# Patient Record
Sex: Female | Born: 1960 | Race: Black or African American | Hispanic: No | Marital: Married | Smoking: Never smoker
Health system: Southern US, Community
[De-identification: ages and names within clinical notes are randomized; demographics above are authoritative.]

## PROBLEM LIST (undated history)

## (undated) DIAGNOSIS — E785 Hyperlipidemia, unspecified: Secondary | ICD-10-CM

## (undated) DIAGNOSIS — R809 Proteinuria, unspecified: Secondary | ICD-10-CM

## (undated) DIAGNOSIS — J45909 Unspecified asthma, uncomplicated: Secondary | ICD-10-CM

## (undated) DIAGNOSIS — E119 Type 2 diabetes mellitus without complications: Secondary | ICD-10-CM

## (undated) DIAGNOSIS — E786 Lipoprotein deficiency: Secondary | ICD-10-CM

## (undated) DIAGNOSIS — G4700 Insomnia, unspecified: Secondary | ICD-10-CM

## (undated) DIAGNOSIS — D649 Anemia, unspecified: Secondary | ICD-10-CM

## (undated) DIAGNOSIS — T7840XA Allergy, unspecified, initial encounter: Secondary | ICD-10-CM

## (undated) HISTORY — DX: Hyperlipidemia, unspecified: E78.5

## (undated) HISTORY — DX: Type 2 diabetes mellitus without complications: E11.9

## (undated) HISTORY — DX: Proteinuria, unspecified: R80.9

## (undated) HISTORY — PX: CYST EXCISION: SHX5701

## (undated) HISTORY — DX: Lipoprotein deficiency: E78.6

## (undated) HISTORY — DX: Anemia, unspecified: D64.9

## (undated) HISTORY — DX: Insomnia, unspecified: G47.00

## (undated) HISTORY — DX: Allergy, unspecified, initial encounter: T78.40XA

## (undated) HISTORY — PX: UMBILICAL HERNIA REPAIR: SHX196

---

## 1998-08-16 ENCOUNTER — Emergency Department (HOSPITAL_COMMUNITY): Admission: EM | Admit: 1998-08-16 | Discharge: 1998-08-16 | Payer: Self-pay | Admitting: Emergency Medicine

## 1999-06-11 ENCOUNTER — Emergency Department (HOSPITAL_COMMUNITY): Admission: EM | Admit: 1999-06-11 | Discharge: 1999-06-12 | Payer: Self-pay | Admitting: Internal Medicine

## 1999-11-05 ENCOUNTER — Emergency Department (HOSPITAL_COMMUNITY): Admission: EM | Admit: 1999-11-05 | Discharge: 1999-11-05 | Payer: Self-pay | Admitting: Emergency Medicine

## 2000-04-22 ENCOUNTER — Other Ambulatory Visit: Admission: RE | Admit: 2000-04-22 | Discharge: 2000-04-22 | Payer: Self-pay | Admitting: Gynecology

## 2000-11-25 ENCOUNTER — Other Ambulatory Visit: Admission: RE | Admit: 2000-11-25 | Discharge: 2000-11-25 | Payer: Self-pay | Admitting: Gynecology

## 2001-05-18 ENCOUNTER — Other Ambulatory Visit: Admission: RE | Admit: 2001-05-18 | Discharge: 2001-05-18 | Payer: Self-pay | Admitting: Gynecology

## 2002-06-09 ENCOUNTER — Other Ambulatory Visit: Admission: RE | Admit: 2002-06-09 | Discharge: 2002-06-09 | Payer: Self-pay | Admitting: Gynecology

## 2002-07-12 ENCOUNTER — Emergency Department (HOSPITAL_COMMUNITY): Admission: EM | Admit: 2002-07-12 | Discharge: 2002-07-12 | Payer: Self-pay | Admitting: Emergency Medicine

## 2002-07-12 ENCOUNTER — Encounter: Payer: Self-pay | Admitting: Emergency Medicine

## 2003-06-22 ENCOUNTER — Ambulatory Visit (HOSPITAL_COMMUNITY): Admission: RE | Admit: 2003-06-22 | Discharge: 2003-06-22 | Payer: Self-pay | Admitting: General Surgery

## 2003-06-22 ENCOUNTER — Encounter (INDEPENDENT_AMBULATORY_CARE_PROVIDER_SITE_OTHER): Payer: Self-pay | Admitting: Specialist

## 2004-02-05 ENCOUNTER — Emergency Department (HOSPITAL_COMMUNITY): Admission: EM | Admit: 2004-02-05 | Discharge: 2004-02-05 | Payer: Self-pay | Admitting: Emergency Medicine

## 2008-02-05 ENCOUNTER — Encounter: Admission: RE | Admit: 2008-02-05 | Discharge: 2008-02-05 | Payer: Self-pay | Admitting: Family Medicine

## 2010-09-28 NOTE — Op Note (Signed)
Stacey Simon, YEARWOOD                           ACCOUNT NO.:  [id number]   MEDICAL RECORD NO.:  [id number]                   PATIENT TYPE:  OIB   LOCATION:  2899                                 FACILITY:  [facility]   PHYSICIAN:  Leonie Man, M.D.                DATE OF BIRTH:  [birth year]   DATE OF PROCEDURE:  06/22/2003  DATE OF DISCHARGE:                                 OPERATIVE REPORT   PREOPERATIVE DIAGNOSIS:  Incarcerated epigastric hernia.   POSTOPERATIVE DIAGNOSIS:  Incarcerated epigastric hernia.   OPERATION PERFORMED:  Repair of incarcerated epigastric hernia with mesh.   SURGEON:  Leonie Man, M.D.   ASSISTANT:  Nurse.   ANESTHESIA:  General.   INDICATIONS FOR PROCEDURE:  The patient is a 50 year old woman with a mass  just superior to the umbilicus which has been causing pain after eating and  on examination, very difficult to reduce.  She has not had any symptoms of  bowel obstruction, however.  She comes to the operating room now for repair  of this hernia after the risks and potential benefits of surgery had been  fully discussed and all questions answered.  The patient gives consent.   DESCRIPTION OF PROCEDURE:  Following the induction of satisfactory general  anesthesia which required a rather difficult intubation, the abdomen was  prepped and draped to be included in the sterile operative field.  I made a  vertical incision over the mass just superior to the umbilicus, deepened  through the skin and subcutaneous tissue carrying it down to the hernial  sac.  The hernial sac was dissected from the surrounding soft tissue down to  the fascia.  The fascia was scored and grasped with Kocher clamps.  The  hernial sac was then opened and removed in its entirety and forwarded for  pathologic evaluation.  Incarcerated omentum within the sac appeared to be  viable and was reduced back into the peritoneal cavity.  I placed a layer of  Seprafilm in a slurry into the  abdomen so as to prevent adherence of the  mesh and then placed a medium mesh plug into the defect and closed it to the  fascia with interrupted 0 Novofil sutures.  At the end of this, the repair  appeared to be intact.  Sponge, instrument and sharp counts were verified.  The subcutaneous tissues were then closed with a running 2-0 Vicryl suture.  The skin was closed with a 4-0 Monocryl running subcuticular suture and then  reinforced with Steri-Strips.  Sterile dressing was applied.  Anesthetic was  reversed and the patient removed from the operating room to the recovery  room in stable condition.  She tolerated the procedure well.  Leonie Man, M.D.    PB/MEDQ  D:  06/22/2003  T:  06/22/2003  Job:  [id number]

## 2014-08-26 ENCOUNTER — Other Ambulatory Visit: Payer: Self-pay | Admitting: Podiatry

## 2015-06-25 ENCOUNTER — Emergency Department (HOSPITAL_COMMUNITY)
Admission: EM | Admit: 2015-06-25 | Discharge: 2015-06-25 | Disposition: A | Discharge status: Home / Self Care | Payer: BC Managed Care – PPO | Source: Home / Self Care | Attending: Family Medicine | Admitting: Family Medicine

## 2015-06-25 ENCOUNTER — Encounter (HOSPITAL_COMMUNITY): Payer: Self-pay | Admitting: Emergency Medicine

## 2015-06-25 ENCOUNTER — Encounter (HOSPITAL_COMMUNITY): Payer: Self-pay | Admitting: *Deleted

## 2015-06-25 ENCOUNTER — Emergency Department (HOSPITAL_COMMUNITY)
Admission: EM | Admit: 2015-06-25 | Discharge: 2015-06-26 | Disposition: A | Discharge status: Home / Self Care | Payer: BC Managed Care – PPO | Attending: Emergency Medicine | Admitting: Emergency Medicine

## 2015-06-25 DIAGNOSIS — R0602 Shortness of breath: Secondary | ICD-10-CM | POA: Diagnosis present

## 2015-06-25 DIAGNOSIS — J45901 Unspecified asthma with (acute) exacerbation: Secondary | ICD-10-CM | POA: Diagnosis not present

## 2015-06-25 DIAGNOSIS — R0600 Dyspnea, unspecified: Secondary | ICD-10-CM

## 2015-06-25 HISTORY — DX: Unspecified asthma, uncomplicated: J45.909

## 2015-06-25 MED ORDER — PREDNISONE 20 MG PO TABS
60.0000 mg | ORAL_TABLET | Freq: Once | ORAL | Status: AC
  Administered 2015-06-25: 60 mg via ORAL
  Filled 2015-06-25: qty 3

## 2015-06-25 MED ORDER — IPRATROPIUM-ALBUTEROL 0.5-2.5 (3) MG/3ML IN SOLN
3.0000 mL | Freq: Once | RESPIRATORY_TRACT | Status: AC
  Administered 2015-06-25: 3 mL via RESPIRATORY_TRACT
  Filled 2015-06-25: qty 3

## 2015-06-25 NOTE — ED Provider Notes (Addendum)
CSN: [id number]     Arrival date & time 06/25/15  1912 History   First MD Initiated Contact with Patient 06/25/15 2005     Chief Complaint  Patient presents with  . Shortness of Breath   (Consider location/radiation/quality/duration/timing/severity/associated sxs/prior Treatment) Patient is a 55 y.o. female presenting with shortness of breath. The history is provided by the patient.  Shortness of Breath Severity:  Moderate Onset quality:  Gradual Duration:  2 weeks Progression:  Worsening Chronicity:  Chronic Context: URI and weather changes   Relieved by: pt stopped using inhaler when she felt like she was using it too much. Ineffective treatments:  Inhaler Associated symptoms: wheezing   Associated symptoms: no abdominal pain, no chest pain and no fever   Risk factors: no tobacco use     Past Medical History  Diagnosis Date  . Asthma    History reviewed. No pertinent past surgical history. History reviewed. No pertinent family history. Social History  Substance Use Topics  . Smoking status: None  . Smokeless tobacco: None  . Alcohol Use: None   OB History    No data available     Review of Systems  Constitutional: Negative.  Negative for fever.  HENT: Negative.   Respiratory: Positive for chest tightness, shortness of breath and wheezing.   Cardiovascular: Negative.  Negative for chest pain.  Gastrointestinal: Negative for abdominal pain.  All other systems reviewed and are negative.   Allergies  Review of patient's allergies indicates no known allergies.  Home Medications   Prior to Admission medications   Medication Sig Start Date End Date Taking? Authorizing Provider  albuterol (PROVENTIL HFA;VENTOLIN HFA) 108 (90 Base) MCG/ACT inhaler Inhale into the lungs every 6 (six) hours as needed for wheezing or shortness of breath.   Yes Historical Provider, MD   Meds Ordered and Administered this Visit  Medications - No data to display  BP 169/95 mmHg  Pulse  79  Temp(Src) 98.2 F (36.8 C) (Oral)  Resp 18  SpO2 100% No data found.   Physical Exam  Constitutional: She is oriented to person, place, and time. She appears well-developed and well-nourished. No distress.  HENT:  Right Ear: External ear normal.  Left Ear: External ear normal.  Mouth/Throat: Oropharynx is clear and moist.  Eyes: Pupils are equal, round, and reactive to light.  Neck: Normal range of motion. Neck supple.  Cardiovascular: Normal rate, regular rhythm, normal heart sounds and intact distal pulses.   Pulmonary/Chest: Effort normal. She has decreased breath sounds. She has wheezes. She has no rhonchi.  Lymphadenopathy:    She has cervical adenopathy.  Neurological: She is alert and oriented to person, place, and time.  Skin: Skin is warm and dry.  Nursing note and vitals reviewed.   ED Course  Procedures (including critical care time)  Labs Review Labs Reviewed - No data to display  Imaging Review No results found.   Visual Acuity Review  Right Eye Distance:   Left Eye Distance:   Bilateral Distance:    Right Eye Near:   Left Eye Near:    Bilateral Near:         MDM   1. Dyspnea    Sent for further eval and treatment of dyspnea, worsening.    Billy Fischer, MD 06/25/15 2025  Billy Fischer, MD 06/25/15 2026

## 2015-06-25 NOTE — ED Notes (Signed)
Pt sent here from [facility] due to asthma flareup.

## 2015-06-25 NOTE — ED Provider Notes (Signed)
CSN: [id number]     Arrival date & time 06/25/15  2135 History  By signing my name below, I, Randa Evens, attest that this documentation has been prepared under the direction and in the presence of Ivor Lions, PA-C. Electronically Signed: Randa Evens, ED Scribe. 06/25/2015. 11:48 PM.    Chief Complaint  Patient presents with  . Asthma     The history is provided by the patient. No language interpreter was used.   HPI Comments: Stacey Simon is a 55 y.o. female who presents to the Emergency Department complaining of chest tightness and SOB onset 3 days prior. Pt states that she has a HX of asthma and that her symptoms are similar to when she has an asthma exacerbation. Pt states she has tried her albuterol inhaler with no relief. Pt doesn't report fever, cough or other related symptoms.   Past Medical History  Diagnosis Date  . Asthma    History reviewed. No pertinent past surgical history. No family history on file. Social History  Substance Use Topics  . Smoking status: Never Smoker   . Smokeless tobacco: None  . Alcohol Use: No   OB History    No data available     Review of Systems  Constitutional: Negative for fever.  Respiratory: Positive for chest tightness and shortness of breath. Negative for cough.    10 Systems reviewed and all are negative for acute change except as noted in the HPI.   Allergies  Review of patient's allergies indicates no known allergies.  Home Medications   Prior to Admission medications   Medication Sig Start Date End Date Taking? Authorizing Provider  albuterol (PROVENTIL HFA;VENTOLIN HFA) 108 (90 Base) MCG/ACT inhaler Inhale into the lungs every 6 (six) hours as needed for wheezing or shortness of breath.    Historical Provider, MD   BP 155/78 mmHg  Pulse 62  Temp(Src) 98.2 F (36.8 C) (Oral)  Resp 16  Ht 5\' 6"  (1.676 m)  Wt 137 lb (62.143 kg)  BMI 22.12 kg/m2  SpO2 100%   Physical Exam   Constitutional: She is oriented to person, place, and time. She appears well-developed and well-nourished. No distress.  HENT:  Head: Normocephalic and atraumatic.  Eyes: Conjunctivae and EOM are normal.  Neck: Neck supple. No tracheal deviation present.  Cardiovascular: Normal rate, regular rhythm and normal heart sounds.  Exam reveals no gallop and no friction rub.   No murmur heard. Pulmonary/Chest: Effort normal. No respiratory distress. She has wheezes. She has rales in the right lower field and the left lower field. She exhibits no tenderness.  Diffuse BL end-expiratory wheezes  Musculoskeletal: Normal range of motion.  Neurological: She is alert and oriented to person, place, and time.  Skin: Skin is warm and dry.  Psychiatric: She has a normal mood and affect. Her behavior is normal.  Nursing note and vitals reviewed.   ED Course  Procedures  DIAGNOSTIC STUDIES: Oxygen Saturation is 100% on RA, normal by my interpretation.    COORDINATION OF CARE: 11:51 PM-Discussed treatment plan with pt at bedside and pt agreed to plan.   Imaging Review Dg Chest 2 View  06/26/2015  CLINICAL DATA:  Upper back pain, shortness of breath, and cough tonight. EXAM: CHEST  2 VIEW COMPARISON:  06/21/2003 FINDINGS: Thoracic scoliosis convex towards the right. Normal heart size and pulmonary vascularity. No focal airspace disease or consolidation in the lungs. No blunting of costophrenic angles. No pneumothorax. Mediastinal contours appear intact. Degenerative  changes in the right shoulder. IMPRESSION: No active cardiopulmonary disease. Electronically Signed   By: Lucienne Capers M.D.   On: 06/26/2015 00:42    MDM   Final diagnoses:  Asthma, unspecified asthma severity, with acute exacerbation   Asthma exacerbation. Xray negative. Patient ambulated in ED with O2 saturations maintained >90, no current signs of respiratory distress. Lung exam improved after nebulizer treatment. Prednisone given in  the ED and pt will bd dc with 5 day burst. Pt states they are breathing at baseline. Pt has been instructed to continue using prescribed medications and to speak with PCP about today's exacerbation.   I personally performed the services described in this documentation, which was scribed in my presence. The recorded information has been reviewed and is accurate.  [place] Lions, PA-C 06/27/15 2135  Carmin Muskrat, MD 06/28/15 [phone]

## 2015-06-25 NOTE — ED Notes (Signed)
Shortness  Of breath   History  Of  Asthma    -        Pt     Uses  An inhaler

## 2015-06-25 NOTE — ED Notes (Signed)
Pt c/o tightness due to her asthma, lung sounds clear. Pt states she used her inhaler around 3pm, helped for a little bit.

## 2015-06-26 ENCOUNTER — Emergency Department (HOSPITAL_COMMUNITY): Payer: BC Managed Care – PPO

## 2015-06-26 MED ORDER — ALBUTEROL SULFATE HFA 108 (90 BASE) MCG/ACT IN AERS
1.0000 | INHALATION_SPRAY | Freq: Four times a day (QID) | RESPIRATORY_TRACT | Status: DC | PRN
Start: 2015-06-26 — End: 2015-06-28

## 2015-06-26 MED ORDER — PREDNISONE 20 MG PO TABS: 60.0000 mg | ORAL_TABLET | Freq: Every day | ORAL | Status: DC

## 2015-06-26 NOTE — Discharge Instructions (Signed)
Stacey Simon,  Nice meeting you! Please follow-up with your primary care provider. Return to the emergency department if you develop increasing shortness of breath, fevers, weakness, or do not improve. Feel better soon!  S. Wendie Simmer, PA-C  Asthma, Acute Bronchospasm Acute bronchospasm caused by asthma is also referred to as an asthma attack. Bronchospasm means your air passages become narrowed. The narrowing is caused by inflammation and tightening of the muscles in the air tubes (bronchi) in your lungs. This can make it hard to breathe or cause you to wheeze and cough. CAUSES Possible triggers are:  Animal dander from the skin, hair, or feathers of animals.  Dust mites contained in house dust.  Cockroaches.  Pollen from trees or grass.  Mold.  Cigarette or tobacco smoke.  Air pollutants such as dust, household cleaners, hair sprays, aerosol sprays, paint fumes, strong chemicals, or strong odors.  Cold air or weather changes. Cold air may trigger inflammation. Winds increase molds and pollens in the air.  Strong emotions such as crying or laughing hard.  Stress.  Certain medicines such as aspirin or beta-blockers.  Sulfites in foods and drinks, such as dried fruits and wine.  Infections or inflammatory conditions, such as a flu, cold, or inflammation of the nasal membranes (rhinitis).  Gastroesophageal reflux disease (GERD). GERD is a condition where stomach acid backs up into your esophagus.  Exercise or strenuous activity. SIGNS AND SYMPTOMS   Wheezing.  Excessive coughing, particularly at night.  Chest tightness.  Shortness of breath. DIAGNOSIS  Your health care provider will ask you about your medical history and perform a physical exam. A chest X-ray or blood testing may be performed to look for other causes of your symptoms or other conditions that may have triggered your asthma attack. TREATMENT  Treatment is aimed at reducing  inflammation and opening up the airways in your lungs. Most asthma attacks are treated with inhaled medicines. These include quick relief or rescue medicines (such as bronchodilators) and controller medicines (such as inhaled corticosteroids). These medicines are sometimes given through an inhaler or a nebulizer. Systemic steroid medicine taken by mouth or given through an IV tube also can be used to reduce the inflammation when an attack is moderate or severe. Antibiotic medicines are only used if a bacterial infection is present.  HOME CARE INSTRUCTIONS   Rest.  Drink plenty of liquids. This helps the mucus to remain thin and be easily coughed up. Only use caffeine in moderation and do not use alcohol until you have recovered from your illness.  Do not smoke. Avoid being exposed to secondhand smoke.  You play a critical role in keeping yourself in good health. Avoid exposure to things that cause you to wheeze or to have breathing problems.  Keep your medicines up-to-date and available. Carefully follow your health care provider's treatment plan.  Take your medicine exactly as prescribed.  When pollen or pollution is bad, keep windows closed and use an air conditioner or go to places with air conditioning.  Asthma requires careful medical care. See your health care provider for a follow-up as advised. If you are more than [redacted] weeks pregnant and you were prescribed any new medicines, let your obstetrician know about the visit and how you are doing. Follow up with your health care provider as directed.  After you have recovered from your asthma attack, make an appointment with your outpatient doctor to talk about ways to reduce the likelihood of future attacks. If  you do not have a doctor who manages your asthma, make an appointment with a primary care doctor to discuss your asthma. SEEK IMMEDIATE MEDICAL CARE IF:   You are getting worse.  You have trouble breathing. If severe, call your local  emergency services (911 in the U.S.).  You develop chest pain or discomfort.  You are vomiting.  You are not able to keep fluids down.  You are coughing up yellow, green, brown, or bloody sputum.  You have a fever and your symptoms suddenly get worse.  You have trouble swallowing. MAKE SURE YOU:   Understand these instructions.  Will watch your condition.  Will get help right away if you are not doing well or get worse.   This information is not intended to replace advice given to you by your health care provider. Make sure you discuss any questions you have with your health care provider.   Document Released: 08/14/2006 Document Revised: 05/04/2013 Document Reviewed: 11/04/2012 Elsevier Interactive Patient Education 2016 [employer].   Emergency Department Resource Guide 1) Find a Doctor and Pay Out of Pocket Although you won't have to find out who is covered by your insurance plan, it is a good idea to ask around and get recommendations. You will then need to call the office and see if the doctor you have chosen will accept you as a new patient and what types of options they offer for patients who are self-pay. Some doctors offer discounts or will set up payment plans for their patients who do not have insurance, but you will need to ask so you aren't surprised when you get to your appointment.  2) Contact Your Local Health Department Not all health departments have doctors that can see patients for sick visits, but many do, so it is worth a call to see if yours does. If you don't know where your local health department is, you can check in your phone book. The CDC also has a tool to help you locate your state's health department, and many state websites also have listings of all of their local health departments.  3) Find a [facility] If your illness is not likely to be very severe or complicated, you may want to try a walk in clinic. These are popping up all over the  country in pharmacies, drugstores, and shopping centers. They're usually staffed by nurse practitioners or physician assistants that have been trained to treat common illnesses and complaints. They're usually fairly quick and inexpensive. However, if you have serious medical issues or chronic medical problems, these are probably not your best option.  No Primary Care Doctor: - Call Health Connect at  [phone] - they can help you locate a primary care doctor that  accepts your insurance, provides certain services, etc. - Physician Referral Service- [phone]  Chronic Pain Problems: Organization         Address  Phone   Notes  [facility]  [phone] Patients need to be referred by their primary care doctor.   Medication Assistance: Organization         Address  Phone   Notes  [facility] Medication [facility] [address] [place], [place] [address] [phone] --Must be a resident of [facility] -- Must have NO insurance coverage whatsoever (no Medicaid/ Medicare, etc.) -- The pt. MUST have a primary care doctor that directs their care regularly and follows them in the community   [facility]ist  (866)  [place]  [phone]    Agencies that provide inexpensive medical care: Organization         Address  Phone   Notes  [place]  [phone]   [facility]    [phone]   [facility] [place], [place] [address] [phone]   [place] [address], [place] [phone]   Planned Parenthood    [phone]   [facility]    [phone]   [place] and [place] [address], [place] Phone:  [phone], Fax:  [phone] Hours of Operation:  9 am - 6 pm, M-F.  Also accepts Medicaid/Medicare and self-pay.  [facility] for [place], [address], [place] Phone: [phone], Fax: [phone]. Hours of Operation:  8:30 am - 5:30 pm, M-F.  Also accepts Medicaid and self-pay.  [facility] [address], [place] Phone: [phone]   [place], [place], [place] [phone], Ext. [phone] Mondays & Thursdays: 7-9 AM.  First 15 patients are seen on a first come, first serve basis.    [place] Providers:  Organization         Address  Phone   Notes  [facility] [address], [place] [phone] Also accepts self-pay patients.  [facility] [id number] [place], [place]  [phone]   [place], [address], [place] [phone]   [facility] [address], [place] [phone]   Lucianne Lei [address], [place]   [phone] Only accepts [place] Access [place] patients after they have their name applied to their card.   Self-Pay (no insurance) in [facility]:  Organization         Address  Phone   Notes  Sickle Cell Patients, [facility] [place] [phone]   [facility] [place] [phone]   [facility] [place]  [place], [place], [place] [phone]   [facility]/Dr. Osei-Bonsu  [address] [place] or [address], [place] [phone] Phone number for both [place] and [place] locations is the same.  [facility] [address] [place] [phone]   [facility] [address] [place] or [address] [phone]   [facility] [address] [place] [phone], phone; [phone], fax Sees patients 1st and 3rd Saturday of every month.  Must  not qualify for public or private insurance (i.e. Medicaid, Medicare, [place] Health Choice, Veterans' Benefits)  Household income should be no more than 200% of the poverty level The clinic cannot treat you if you are pregnant or think you are pregnant  Sexually transmitted diseases are not treated at the clinic.    Dental Care: Organization         Address  Phone  Notes  [facility] Department of [facility] [place] [phone] Accepts children up to age 33 who are enrolled in [place] or [place]; pregnant women with  a Medicaid card; and children who have applied for Medicaid or [place] Health Choice, but were declined, whose parents can pay a reduced fee at time of service.  [facility] Department of [facility]  [address], [place] [phone] Accepts children up to age 33 who are enrolled in [place] or [place]; pregnant women with a Medicaid card; and children who have applied for Medicaid or [place] Health Choice, but were declined, whose parents can pay a reduced fee at time of service.  [place] Adult Dental Access PROGRAM  [place] [phone] Patients are seen by appointment only. Walk-ins are not accepted. [place] will see patients 6 years of age and older. Monday - Tuesday (8am-5pm) Most Wednesdays (8:30-5pm) $30 per visit, cash only  [facility] Adult Dental Access PROGRAM  [address], [facility] [phone] Patients are seen by appointment only. Walk-ins are not accepted. [place] will see patients 54 years of age and older. One Wednesday Evening (Monthly: Volunteer Based).  $30 per visit, cash only  [place]  [phone] for adults; Children under age 77, call Graduate Pediatric Dentistry at [phone]. Children aged 66-14, please call [phone] to request a pediatric application.  Dental services are  provided in all areas of dental care including fillings, crowns and bridges, complete and partial dentures, implants, gum treatment, root canals, and extractions. Preventive care is also provided. Treatment is provided to both adults and children. Patients are selected via a lottery and there is often a waiting list.   [facility] [address] [place]  [phone] [email or web address]   [facility] [address] [place], [place] [phone], Ext. [phone] Second and Fourth Thursday of each month, opens at 6:30 AM; Clinic ends at 9 AM.  Patients are seen on a first-come first-served basis, and a limited number are seen during each clinic.   [facility]  [address] Hillard Danker [place], [place] [phone]   Eligibility Requirements You must have lived in [place], [place], or [place] counties for at least the last three months.   You cannot be eligible for state or federal sponsored [employer], including [employer], [place], or [employer].   You generally cannot be eligible for healthcare insurance through your employer.    How to apply: Eligibility screenings are held every Tuesday and Wednesday afternoon from 1:00 pm until 4:00 pm. You do not need an appointment for the interview!  [facility] [address] [place], [place]   [place]  [place] Department  [place]  [phone]    Behavioral Health Resources in the Community: Intensive Outpatient Programs Organization         Address  Phone  Notes  [place]. [address], [place], [place] [phone]   [facility] Outpatient [address] [place], [place]   ADS: Alcohol & Drug Svcs [address] [place], [place]   [place] [address]  [place], [place] or [phone]   Substance Abuse Resources Organization         Address  Phone  Notes  Alcohol and Drug Services  [phone]   [facility]  [phone]   [facility] [place]   [facility]  [phone]   Residential & Outpatient Substance Abuse Program  [phone]   Psychological Services Organization         Address  Phone  Notes  [facility] [place]  [place]  [phone]   [place] [address], [place] or [phone]    Mobile Crisis Teams Organization         Address  Phone  Notes  [facility], Mobile Crisis Care Unit  [phone]   [facility] Psychotherapeutic Services  [address] [place], [place]   [place] [address] [place] [phone]    Self-Help/Support Groups Organization         Address  Phone             Notes  [place]. of [place] - variety of support groups  [place] Call for more information  Narcotics Anonymous (NA), [facility] [address], [employer] [place]  2 meetings at this location   Special educational needs teacher         Address  Phone  Notes  [facility] Residential Treatment [place],    [place]  [phone]   [facility]  [address] [place] [phone], [place], [place]   [place], [place] [phone] Admissions: 8am-3pm M-F  Incentives Substance [place] [address],    [place], [place] [phone]   [facility] [address] [place], [place], [place]   [facility] (2-Rh) [address],  [place], [place]   [facility] - Intensive Outpatient [address] Kristeen [address], [place], [place]   [facility] (Rew.) [place].,  [place], [place] [phone] or  [phone]   [facility] Treatment Services (RTS) [address] [place], [place] Accepts Medicaid  [facility] [address]  [place] [phone] Substance Abuse/Addiction Treatment   [facility] Organization         Address  Phone  Notes  [facility]  [phone]   Domenic Schwab, PhD [address] Arlis Porta [place], [place]   [phone] or [phone]   [place], [place] [phone]   [facility] [address], [place], [place] [phone] Insurance/Medicaid/sponsorship through [facility] and Families [address] [place]                                    [place], [place] [phone] [place] [address], [place] [phone]    Dr. Adele Schilder  [phone]   [facility] [place] Dept. 1) [address] [place] 2) [place] 3)  [place] [address], [place] [phone]  [phone]   [place] [phone] or [phone] (After Hours)

## 2015-06-28 ENCOUNTER — Ambulatory Visit (INDEPENDENT_AMBULATORY_CARE_PROVIDER_SITE_OTHER): Payer: BC Managed Care – PPO | Admitting: Medical

## 2015-06-28 ENCOUNTER — Encounter: Payer: Self-pay | Admitting: Medical

## 2015-06-28 VITALS — BP 130/80 | HR 64 | Ht 65.25 in | Wt 189.0 lb

## 2015-06-28 DIAGNOSIS — J309 Allergic rhinitis, unspecified: Secondary | ICD-10-CM

## 2015-06-28 DIAGNOSIS — G4700 Insomnia, unspecified: Secondary | ICD-10-CM

## 2015-06-28 DIAGNOSIS — Z2821 Immunization not carried out because of patient refusal: Secondary | ICD-10-CM | POA: Diagnosis not present

## 2015-06-28 DIAGNOSIS — J4540 Moderate persistent asthma, uncomplicated: Secondary | ICD-10-CM | POA: Diagnosis not present

## 2015-06-28 MED ORDER — ALBUTEROL SULFATE (2.5 MG/3ML) 0.083% IN NEBU: 2.5000 mg | INHALATION_SOLUTION | Freq: Four times a day (QID) | RESPIRATORY_TRACT | Status: DC | PRN

## 2015-06-28 MED ORDER — ALBUTEROL SULFATE HFA 108 (90 BASE) MCG/ACT IN AERS: 1.0000 | INHALATION_SPRAY | Freq: Four times a day (QID) | RESPIRATORY_TRACT | Status: DC | PRN

## 2015-06-28 MED ORDER — BUDESONIDE 180 MCG/ACT IN AEPB: 2.0000 | INHALATION_SPRAY | Freq: Two times a day (BID) | RESPIRATORY_TRACT | Status: DC

## 2015-06-28 NOTE — Progress Notes (Signed)
Subjective Chief Complaint  Patient presents with  . New Patient (Initial Visit)    asthma problems. sees no other doctors. has normally went to UC. did a pft a while ago   Here as a new patient today.  Has hx/o asthma. primarily been going to urgent care and was advised to get a PCP.  Patient Galen Manila referred her here  Has hx/o asthma, diagnosed as a child.  Lately has intermittent problems with asthma.   Recently had a flare up, was using inhaler a lot.   Ended up getting pro air instead of her usual ventolin, was given prednisone, had normal chest xray.  She notes some improvement , still has some prednisone left to take, and was a little wheezy this morning.  She is a non smoker.   No pets in the home.  Asthma is usually flared up by grasses, dust, environmental allergens.  Not taking allergy medication.  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  Has a second job.  She notes some epigastric tenderness.  Saw urgent care for this in the past, was given stool softener for this.  Has BM daily. Does get some bloating and gas.   This has been an issue for a few months.   Last physical was few years ago.  No prior colonoscopy.     Has problem with sleep, trouble getting to sleep.  Works til 9pm several nights a week.    Past Medical History  Diagnosis Date  . Asthma    ROS as in subjective   Objective: BP 130/80 mmHg  Pulse 64  Ht 5' 5.25" (1.657 m)  Wt 189 lb (85.73 kg)  BMI 31.22 kg/m2  General appearance: alert, no distress, WD/WN, female HEENT: normocephalic, sclerae anicteric, TMs pearly, nares patent, no discharge or erythema, pharynx normal Oral cavity: MMM, no lesions Neck: supple, no lymphadenopathy, no thyromegaly, no masses Heart: RRR, normal S1, S2, no murmurs Lungs: CTA bilaterally, no wheezes, rhonchi, or rales Abdomen: +bs, soft, non tender, non distended, no masses, no hepatomegaly, no splenomegaly Pulses: 2+ symmetric, upper and lower  extremities, normal cap refill Ext: no edema   Assessment: Encounter Diagnoses  Name Primary?  Marland Kitchen Asthma, moderate persistent, uncomplicated Yes  . Allergic rhinitis, unspecified allergic rhinitis type   . Insomnia   . Influenza vaccination declined      Plan: Asthma - discussed her concerns, asthma history.  Begin trial of Pulmicort flex haler 2 puffs BID, c/t albuterol prn.   Discussed proper use of medication, prevention, f/u.   Recheck 84mo for physical and f/u.  Allergic rhinitis - begin Benadryl QHS Insomnia - discussed sleep hygiene, work schedule, and begin Benadryl QHS as a sleep aid  Counseled on the influenza virus vaccine.  Vaccine information sheet given.  Influenza vaccine given after consent obtained.

## 2015-07-31 ENCOUNTER — Other Ambulatory Visit (HOSPITAL_COMMUNITY)
Admission: RE | Admit: 2015-07-31 | Discharge: 2015-07-31 | Disposition: A | Discharge status: Home / Self Care | Payer: BC Managed Care – PPO | Source: Ambulatory Visit | Attending: Medical | Admitting: Medical

## 2015-07-31 ENCOUNTER — Telehealth: Payer: Self-pay

## 2015-07-31 ENCOUNTER — Ambulatory Visit (INDEPENDENT_AMBULATORY_CARE_PROVIDER_SITE_OTHER): Payer: BC Managed Care – PPO | Admitting: Medical

## 2015-07-31 ENCOUNTER — Encounter: Payer: Self-pay | Admitting: Medical

## 2015-07-31 VITALS — BP 140/80 | HR 88 | Resp 16 | Ht 65.0 in | Wt 187.0 lb

## 2015-07-31 DIAGNOSIS — J4540 Moderate persistent asthma, uncomplicated: Secondary | ICD-10-CM | POA: Diagnosis not present

## 2015-07-31 DIAGNOSIS — G4700 Insomnia, unspecified: Secondary | ICD-10-CM

## 2015-07-31 DIAGNOSIS — Z1239 Encounter for other screening for malignant neoplasm of breast: Secondary | ICD-10-CM | POA: Diagnosis not present

## 2015-07-31 DIAGNOSIS — Z124 Encounter for screening for malignant neoplasm of cervix: Secondary | ICD-10-CM

## 2015-07-31 DIAGNOSIS — Z1211 Encounter for screening for malignant neoplasm of colon: Secondary | ICD-10-CM

## 2015-07-31 DIAGNOSIS — Z0000 Encounter for general adult medical examination without abnormal findings: Secondary | ICD-10-CM | POA: Diagnosis not present

## 2015-07-31 DIAGNOSIS — J309 Allergic rhinitis, unspecified: Secondary | ICD-10-CM

## 2015-07-31 DIAGNOSIS — Z23 Encounter for immunization: Secondary | ICD-10-CM

## 2015-07-31 DIAGNOSIS — Z01419 Encounter for gynecological examination (general) (routine) without abnormal findings: Secondary | ICD-10-CM | POA: Diagnosis present

## 2015-07-31 LAB — POCT URINALYSIS DIPSTICK
BILIRUBIN UA: NEGATIVE
Blood, UA: NEGATIVE
GLUCOSE UA: NEGATIVE
KETONES UA: NEGATIVE
LEUKOCYTES UA: NEGATIVE
Nitrite, UA: NEGATIVE
PROTEIN UA: NEGATIVE
Spec Grav, UA: 1.02
Urobilinogen, UA: NEGATIVE
pH, UA: 6

## 2015-07-31 LAB — LIPID PANEL
CHOLESTEROL: 155 mg/dL (ref 125–200)
HDL: 39 mg/dL — ABNORMAL LOW (ref >=46)
LDL Cholesterol: 94 mg/dL (ref <130)
Total CHOL/HDL Ratio: 4 Ratio (ref <=5.0)
Triglycerides: 110 mg/dL (ref <150)
VLDL: 22 mg/dL (ref <30)

## 2015-07-31 LAB — COMPREHENSIVE METABOLIC PANEL
ALT: 17 U/L (ref 6–29)
AST: 18 U/L (ref 10–35)
Albumin: 4.3 g/dL (ref 3.6–5.1)
Alkaline Phosphatase: 78 U/L (ref 33–130)
BUN: 9 mg/dL (ref 7–25)
CHLORIDE: 104 mmol/L (ref 98–110)
CO2: 24 mmol/L (ref 20–31)
CREATININE: 0.88 mg/dL (ref 0.50–1.05)
Calcium: 9.4 mg/dL (ref 8.6–10.4)
Glucose, Bld: 147 mg/dL — ABNORMAL HIGH (ref 65–99)
Potassium: 4.5 mmol/L (ref 3.5–5.3)
SODIUM: 139 mmol/L (ref 135–146)
TOTAL PROTEIN: 7.3 g/dL (ref 6.1–8.1)
Total Bilirubin: 0.5 mg/dL (ref 0.2–1.2)

## 2015-07-31 LAB — CBC
HCT: 37.5 % (ref 36.0–46.0)
Hemoglobin: 12.3 g/dL (ref 12.0–15.0)
MCH: 26.8 pg (ref 26.0–34.0)
MCHC: 32.8 g/dL (ref 30.0–36.0)
MCV: 81.7 fL (ref 78.0–100.0)
MPV: 10 fL (ref 8.6–12.4)
PLATELETS: 347 10*3/uL (ref 150–400)
RBC: 4.59 MIL/uL (ref 3.87–5.11)
RDW: 14.3 % (ref 11.5–15.5)
WBC: 5.6 10*3/uL (ref 4.0–10.5)

## 2015-07-31 LAB — HEMOGLOBIN A1C
Hgb A1c MFr Bld: 7 % — ABNORMAL HIGH (ref <5.7)
MEAN PLASMA GLUCOSE: 154 mg/dL — AB (ref <117)

## 2015-07-31 NOTE — Patient Instructions (Signed)
Encounter Diagnoses  Name Primary?  . Routine general medical examination at a health care facility Yes  . Need for Tdap vaccination   . Need for prophylactic vaccination and inoculation against influenza   . Need for prophylactic vaccination against Streptococcus pneumoniae (pneumococcus)   . Screening for breast cancer   . Screening for cervical cancer   . Special screening for malignant neoplasms, colon   . Asthma, moderate persistent, uncomplicated   . Allergic rhinitis, unspecified allergic rhinitis type   . Insomnia    Recommendations:  Schedule a mammogram  We will call with lab and pap smear results  We will refer you for your first screening colonoscopy  Check insurance coverage for weight loss medications such as Contrave, Qsymia, and Saxenda See your eye doctor yearly for routine vision care. See your dentist yearly for routine dental care including hygiene visits twice yearly. We updated your Tdap (tetanus, diptheria, and acellular pertussis) vaccine today We updated your pneumococcal vaccine today We updated your influenza vaccine today Continue the Pulmicort twice daily for asthma preventative measures Use your albuterol inhaler as needed for wheezing, cough, and shortness of breath   Colonoscopy A colonoscopy is an exam to look at the entire large intestine (colon). This exam can help find problems such as tumors, polyps, inflammation, and areas of bleeding. The exam takes about 1 hour.  LET [facility] CARE PROVIDER KNOW ABOUT:   Any allergies you have.  All medicines you are taking, including vitamins, herbs, eye drops, creams, and over-the-counter medicines.  Previous problems you or members of your family have had with the use of anesthetics.  Any blood disorders you have.  Previous surgeries you have had.  Medical conditions you have. RISKS AND COMPLICATIONS  Generally, this is a safe procedure. However, as with any procedure, complications can  occur. Possible complications include:  Bleeding.  Tearing or rupture of the colon wall.  Reaction to medicines given during the exam.  Infection (rare). BEFORE THE PROCEDURE   Ask your health care provider about changing or stopping your regular medicines.  You may be prescribed an oral bowel prep. This involves drinking a large amount of medicated liquid, starting the day before your procedure. The liquid will cause you to have multiple loose stools until your stool is almost clear or light green. This cleans out your colon in preparation for the procedure.  Do not eat or drink anything else once you have started the bowel prep, unless your health care provider tells you it is safe to do so.  Arrange for someone to drive you home after the procedure. PROCEDURE   You will be given medicine to help you relax (sedative).  You will lie on your side with your knees bent.  A long, flexible tube with a light and camera on the end (colonoscope) will be inserted through the rectum and into the colon. The camera sends video back to a computer screen as it moves through the colon. The colonoscope also releases carbon dioxide gas to inflate the colon. This helps your health care provider see the area better.  During the exam, your health care provider may take a small tissue sample (biopsy) to be examined under a microscope if any abnormalities are found.  The exam is finished when the entire colon has been viewed. AFTER THE PROCEDURE   Do not drive for 24 hours after the exam.  You may have a small amount of blood in your stool.  You may pass  moderate amounts of gas and have mild abdominal cramping or bloating. This is caused by the gas used to inflate your colon during the exam.  Ask when your test results will be ready and how you will get your results. Make sure you get your test results.   This information is not intended to replace advice given to you by your health care provider.  Make sure you discuss any questions you have with your health care provider.   Document Released: 04/26/2000 Document Revised: 02/17/2013 Document Reviewed: 01/04/2013 Elsevier Interactive Patient Education [employer].

## 2015-07-31 NOTE — Progress Notes (Signed)
Subjective:   HPI  Stacey Simon is a 55 y.o. female who presents for a complete physical.  I saw her recently as a new patient to establish care and for asthma.  She has hx/o 2 prior pregnancies, 2 live births, but lost one of her twin sons as an infant.   LMP several years ago in early 29s.  Her husband is also here today for a new patient physical visit.   Reviewed their medical, surgical, family, social, medication, and allergy history and updated chart as appropriate.  Past Medical History  Diagnosis Date  . Allergy   . Asthma     a few prior hospitalizations, last 1992    Past Surgical History  Procedure Laterality Date  . Umbilical hernia repair    . Cesarean section    . Cyst excision      left volar foot    Social History   Social History  . Marital Status: Married    Spouse Name: N/A  . Number of Children: N/A  . Years of Education: N/A   Occupational History  . Not on file.   Social History Main Topics  . Smoking status: Never Smoker   . Smokeless tobacco: Not on file  . Alcohol Use: No  . Drug Use: No  . Sexual Activity: Not on file   Other Topics Concern  . Not on file   Social History Narrative   Married, has 2 children, teaches 6,7,8 grades, teaches health and PE.  Exercise - walk, and exercise with her students.   Has 2nd degree black belt in tae kwon do.   From [place] originally.    Family History  Problem Relation Age of Onset  . Asthma Mother   . Hypertension Mother   . Asthma Sister   . Asthma Brother   . Asthma Daughter   . Asthma Son   . Hypertension Maternal Grandmother   . Hypertension Paternal Grandmother   . Hypertension Paternal Grandfather   . Stroke Paternal Grandfather   . Cancer Maternal Aunt     breast     Current outpatient prescriptions:  .  budesonide (PULMICORT FLEXHALER) 180 MCG/ACT inhaler, Inhale 2 puffs into the lungs 2 (two) times daily., Disp: 1 each, Rfl: 2 .  albuterol (PROVENTIL  HFA;VENTOLIN HFA) 108 (90 Base) MCG/ACT inhaler, Inhale 1-2 puffs into the lungs every 6 (six) hours as needed for wheezing or shortness of breath. (Patient not taking: Reported on 07/31/2015), Disp: 1 Inhaler, Rfl: 0 .  albuterol (PROVENTIL) (2.5 MG/3ML) 0.083% nebulizer solution, Take 3 mLs (2.5 mg total) by nebulization every 6 (six) hours as needed for wheezing or shortness of breath. (Patient not taking: Reported on 07/31/2015), Disp: 75 mL, Rfl: 1 .  predniSONE (DELTASONE) 20 MG tablet, Take 3 tablets (60 mg total) by mouth daily. (Patient not taking: Reported on 07/31/2015), Disp: 15 tablet, Rfl: 0  No Known Allergies   Review of Systems Constitutional: -fever, -chills, -sweats, -unexpected weight change, -decreased appetite, -fatigue Allergy: -sneezing, -itching, -congestion Dermatology: -changing moles, --rash, -lumps ENT: -runny nose, -ear pain, -sore throat, -hoarseness, -sinus pain, -teeth pain, - ringing in ears, -hearing loss, -nosebleeds Cardiology: -chest pain, -palpitations, -swelling, -difficulty breathing when lying flat, -waking up short of breath Respiratory: -cough, -shortness of breath, -difficulty breathing with exercise or exertion, -wheezing, -coughing up blood Gastroenterology: -abdominal pain, -nausea, -vomiting, -diarrhea, -constipation, -blood in stool, -changes in bowel movement, -difficulty swallowing or eating Hematology: -bleeding, -bruising  Musculoskeletal: -joint  aches, -muscle aches, -joint swelling, -back pain, -neck pain, -cramping, -changes in gait Ophthalmology: denies vision changes, eye redness, itching, discharge Urology: -burning with urination, -difficulty urinating, -blood in urine, -urinary frequency, -urgency, -incontinence Neurology: -headache, -weakness, -tingling, -numbness, -memory loss, -falls, -dizziness Psychology: -depressed mood, -agitation, -sleep problems     Objective:   Physical Exam  BP 140/80 mmHg  Pulse 88  Resp 16  Ht 5\' 5"   (1.651 m)  Wt 187 lb (84.823 kg)  BMI 31.12 kg/m2  General appearance: alert, no distress, WD/WN, black female Skin: no worrisome lesions, few scattered macules, dry skin of mid low back HEENT: normocephalic, conjunctiva/corneas normal, sclerae anicteric, PERRLA, EOMi, nares patent, no discharge or erythema, pharynx normal Oral cavity: MMM, tongue normal, teeth in good repair Neck: supple, no lymphadenopathy, no thyromegaly, no masses, normal ROM, no bruits Chest: non tender, normal shape and expansion Heart: RRR, normal S1, S2, no murmurs Lungs: CTA bilaterally, no wheezes, rhonchi, or rales Abdomen: +bs, soft, lower transverse C section scar, umbilical small surgical scar, non tender, non distended, no masses, no hepatomegaly, no splenomegaly, no bruits Back: non tender, normal ROM, no scoliosis Musculoskeletal: surgical scar left volar foot, upper extremities non tender, no obvious deformity, normal ROM throughout, lower extremities non tender, no obvious deformity, normal ROM throughout Extremities: no edema, no cyanosis, no clubbing Pulses: 2+ symmetric, upper and lower extremities, normal cap refill Neurological: alert, oriented x 3, CN2-12 intact, strength normal upper extremities and lower extremities, sensation normal throughout, DTRs 2+ throughout, no cerebellar signs, gait normal Psychiatric: normal affect, behavior normal, pleasant  Breast: nontender, no masses or lumps, no skin changes, no nipple discharge or inversion, no axillary lymphadenopathy Gyn: Normal external genitalia without lesions, vagina with normal mucosa, cervix without lesions, no cervical motion tenderness, no abnormal vaginal discharge.  Uterus and adnexa not enlarged, nontender, no masses.  Pap performed.  Exam chaperoned by nurse. Rectal: small external hemorrhoid noted, otherwise not examined    Assessment and Plan :    Encounter Diagnoses  Name Primary?  . Routine general medical examination at a  health care facility Yes  . Need for Tdap vaccination   . Need for prophylactic vaccination and inoculation against influenza   . Need for prophylactic vaccination against Streptococcus pneumoniae (pneumococcus)   . Screening for breast cancer   . Screening for cervical cancer   . Special screening for malignant neoplasms, colon   . Asthma, moderate persistent, uncomplicated   . Allergic rhinitis, unspecified allergic rhinitis type   . Insomnia     Physical exam - discussed healthy lifestyle, diet, exercise, preventative care, vaccinations, and addressed their concerns.  Handout given.  Counseled on the Tdap (tetanus, diptheria, and acellular pertussis) vaccine.  Vaccine information sheet given. Tdap vaccine given after consent obtained.  Counseled on the pneumococcal vaccine.  Vaccine information sheet given.  Pneumococcal vaccine PPSV 23 given after consent obtained.  Counseled on the influenza virus vaccine.  Vaccine information sheet given.  Influenza vaccine given after consent obtained.  Recommendations:  Schedule a mammogram  We will call with lab and pap smear results  We will refer you for your first screening colonoscopy  Check insurance coverage for weight loss medications such as Contrave, Qsymia, and Saxenda See your eye doctor yearly for routine vision care. See your dentist yearly for routine dental care including hygiene visits twice yearly. We updated your Tdap (tetanus, diptheria, and acellular pertussis) vaccine today We updated your pneumococcal vaccine today We updated your influenza  vaccine today Continue the Pulmicort twice daily for asthma preventative measures Use your albuterol inhaler as needed for wheezing, cough, and shortness of breath  Follow-up pending labs

## 2015-07-31 NOTE — Telephone Encounter (Signed)
[facility] cancelled thinking that this was a duplicate but we sent in husband and wife referrals so it is not a duplicate

## 2015-08-01 LAB — CYTOLOGY - PAP

## 2015-08-09 ENCOUNTER — Telehealth: Payer: Self-pay | Admitting: Medical

## 2015-08-09 NOTE — Telephone Encounter (Signed)
Please call about labs. Unfortunately her labs show new onset diabetes type 2 and good cholesterol is low.   Pap smear normal. Rest of labs ok.  Please have her return at her convenience to discuss this in detail (30 min appt).   Please also have Stacey Simon report her lab issue to her boss.   Not sure why but neither her nor her husband's labs came back in my inbox.  We had to get Stacey Simon to print them when I realized I didn't get results back on time.   Not sure what happened but this needs to be fixed.

## 2015-08-10 NOTE — Telephone Encounter (Signed)
VM not set up.

## 2015-08-12 DIAGNOSIS — E119 Type 2 diabetes mellitus without complications: Secondary | ICD-10-CM

## 2015-08-12 DIAGNOSIS — E786 Lipoprotein deficiency: Secondary | ICD-10-CM

## 2015-08-12 HISTORY — DX: Lipoprotein deficiency: E78.6

## 2015-08-12 HISTORY — DX: Type 2 diabetes mellitus without complications: E11.9

## 2015-08-14 NOTE — Telephone Encounter (Signed)
L[facility]

## 2015-08-15 ENCOUNTER — Encounter: Payer: Self-pay | Admitting: Medical

## 2015-08-15 NOTE — Telephone Encounter (Signed)
Sent letter

## 2015-08-22 ENCOUNTER — Encounter: Payer: Self-pay | Admitting: Medical

## 2015-08-28 ENCOUNTER — Encounter: Payer: Self-pay | Admitting: Medical

## 2015-08-28 ENCOUNTER — Encounter: Payer: Self-pay | Admitting: Gastroenterology

## 2015-08-28 ENCOUNTER — Other Ambulatory Visit: Payer: Self-pay

## 2015-08-28 ENCOUNTER — Ambulatory Visit (INDEPENDENT_AMBULATORY_CARE_PROVIDER_SITE_OTHER): Payer: BC Managed Care – PPO | Admitting: Medical

## 2015-08-28 VITALS — BP 142/90 | HR 90 | Wt 187.0 lb

## 2015-08-28 DIAGNOSIS — E786 Lipoprotein deficiency: Secondary | ICD-10-CM

## 2015-08-28 DIAGNOSIS — E119 Type 2 diabetes mellitus without complications: Secondary | ICD-10-CM

## 2015-08-28 DIAGNOSIS — J4530 Mild persistent asthma, uncomplicated: Secondary | ICD-10-CM | POA: Diagnosis not present

## 2015-08-28 DIAGNOSIS — E669 Obesity, unspecified: Secondary | ICD-10-CM | POA: Diagnosis not present

## 2015-08-28 DIAGNOSIS — Z1231 Encounter for screening mammogram for malignant neoplasm of breast: Secondary | ICD-10-CM

## 2015-08-28 MED ORDER — FLUTICASONE-SALMETEROL 250-50 MCG/DOSE IN AEPB: 1.0000 | INHALATION_SPRAY | Freq: Two times a day (BID) | RESPIRATORY_TRACT | Status: DC

## 2015-08-28 MED ORDER — LIRAGLUTIDE 18 MG/3ML ~~LOC~~ SOPN: 1.8000 mg | PEN_INJECTOR | Freq: Every day | SUBCUTANEOUS | Status: DC

## 2015-08-28 NOTE — Patient Instructions (Signed)
Type 2 Diabetes  Diabetes is a long-lasting (chronic) disease.  With diabetes, either the pancreas does not make enough of a hormone called insulin, or the body has trouble using the insulin that is made.  Over time, diabetes can damage the eyes, kidneys, and nerves causing retinopathy, nephropathy, and neuropathy.  Diabetes puts you at risk for heart disease and peripheral vascular disease which can lead to heart attack, stroke, foot ulcers, and amputations.    Our goal and hopefully your goal is to manage your diabetes in such a way to slow the progression of the disease and do all we can to keep you healthy  Home Care:   Eat healthy, exercise regularly, limit alcohol, and don't smoke!  Check your blood sugar (glucose) once a day before breakfast, or as indicated by our discussion today.  Take your medications daily, don't run out of medications.  Learn about low blood sugar (hypoglycemia). Know how to treat it.  Wear a necklace or bracelet that says you have diabetes.  Check your feet every night for cuts, sores, blisters, and redness. Tell your medical provider if you have problems.  Maintain a normal body weight, or normal BMI - height to weight ratio of 20-25.  Ask me about this.  BEGIN Metformin 500mg , 1 tablet daily in the morning to help control sugar  BEGIN checking your sugar fasting in the morning, 2-3 days per week   GET HELP RIGHT AWAY IF:  You have trouble keeping your blood sugar in target range.  You have problems with your medicines.  You are sick and not getting better after 24 hours.  You have a sore or wound that is not healing.  You have vision problems or changes.  You have a fever.  Diet: make diet changes as we discussed today  Exercise regularly since it has beneficial effects on the heart and blood sugars. Exercising at least three times per week or 150 minutes per week can be as important as medication to a diabetic.  Find some form of exercise  that you will enjoy doing regularly.  This can include walking, biking, kayaking, golfing, swimming, dance, aerobics, hiking, etc.  If you have joint problems, many local gyms have equipment to accommodate people with specific needs.    Vaccinations:  Diabetics are at increased risk for infection, and illnesses can take longer to resolve.  Current vaccine recommendations include yearly Influenza (flu) vaccine (recommended in October), Pneumococcal vaccine, Hepatitis B vaccine series, Tdap (tetanus, diptheria and pertussis) vaccine every 10 years, and other age appropriate vaccinations.     Office visits:  We recommend routine medical care to make sure we are addressing prevention and issues as they arise.  Typically this could mean twice yearly or up to quarterly depending upon your unique health situation.  Exams should include a yearly physical, a yearly foot exam, and other examination as appropriate.  You should see an eye doctor yearly to help screen for and prevent blood vessel complications in your eyes.  Labs: Diabetics should have blood work done at least twice yearly to monitor your Hemoglobin A1C (a three-month average of your blood sugars) and your cholesterol.  You should have your urine and blood checked yearly to screen for kidney damage.  This may include creatinine and micro-albumin levels.  Other labs as appropriate.    Blood pressure goals:  Goal blood pressure in diabetics should be 130/80 or less. Monitoring your blood pressure with a home blood pressure cuff of  your own is an excellent idea.  If you are prescribed medication for blood pressure, take your medication every day, and don't run out of medication.  Having high blood pressure can damage your heart, eyes, kidneys, and put you at risk for heart attack and stroke.  Tobacco use:  If you smoke, dip or chew, quitting will reduce your risk of heart attack, stroke, peripheral vascular disease, and many cancers.    Diabetic  Report Card for Stacey Simon  August 28, 2015  Below is a summary of recent tests related to your diabetes that can help you manage your health.   Hemoglobin A1C:  Your Hemoglobin A1C values should be less than 7. If these are greater than 7, you have a higher chance of having eye, heart, and kidney problems in the future.   Your most recent Hemoglobin A1C values were:  HGB A1C MFR BLD (%)  Date Value  07/31/2015 7.0*     Cholesterol:  Your LDL Cholesterol (bad cholesterol) values should be less than 100 mg/dL if you do not have cardiovascular disease.  The LDL should be less than 70 mg/dL if you do have cardiovascular disease.  If your LDL is consistently higher than 100 mg/dL, then your risk of heart attack and stroke increases yearly.   Your most recent LDL Cholesterol (bad cholesterol) results were:  HDL is under 40!  We prefer the number to be above 60. Your HDL Cholesterol (good cholesterol) values should be higher than 40 mg/dL.  If your HDL is lower than 40 mg/dL, this increases your risk of heart attack and stroke.    Blood Pressure:  Your blood pressure values should be less than 130/80. Please contact me if your readings at home are consistently higher than this.   Your most recent blood pressure readings at our clinic were:  BP Readings from Last 3 Encounters:  08/28/15 142/90  07/31/15 140/80  06/28/15 130/80    Urine Protein: Having an elevated microalbumin to creatinine ratio is a marker for early kidney damage due to diabetes or high blood pressure.      Fat and Cholesterol Restricted Diet High levels of fat and cholesterol in your blood may lead to various health problems, such as diseases of the heart, blood vessels, gallbladder, liver, and pancreas. Fats are concentrated sources of energy that come in various forms. Certain types of fat, including saturated fat, may be harmful in excess. Cholesterol is a substance needed by your body in small  amounts. Your body makes all the cholesterol it needs. Excess cholesterol comes from the food you eat. When you have high levels of cholesterol and saturated fat in your blood, health problems can develop because the excess fat and cholesterol will gather along the walls of your blood vessels, causing them to narrow. Choosing the right foods will help you control your intake of fat and cholesterol. This will help keep the levels of these substances in your blood within normal limits and reduce your risk of disease. WHAT TYPES OF FAT SHOULD I CHOOSE?  Choose healthy fats more often. Choose monounsaturated and polyunsaturated fats, such as olive and canola oil, flaxseeds, walnuts, almonds, and seeds.  Eat more omega-3 fats. Good choices include salmon, mackerel, sardines, tuna, flaxseed oil, and ground flaxseeds. Aim to eat fish at least two times a week.  Limit saturated fats. Saturated fats are primarily found in animal products, such as meats, butter, and cream. Plant sources of saturated fats include palm oil,  palm kernel oil, and coconut oil.  Avoid foods with partially hydrogenated oils in them. These contain trans fats. Examples of foods that contain trans fats are stick margarine, some tub margarines, cookies, crackers, and other baked goods. WHAT GENERAL GUIDELINES DO I NEED TO FOLLOW? These guidelines for healthy eating will help you control your intake of fat and cholesterol:  Check food labels carefully to identify foods with trans fats or high amounts of saturated fat.  Fill one half of your plate with vegetables and green salads.  Fill one fourth of your plate with whole grains. Look for the word "whole" as the first word in the ingredient list.  Fill one fourth of your plate with lean protein foods.  Limit fruit to two servings a day. Choose fruit instead of juice.  Eat more foods that contain soluble fiber. Examples of foods that contain this type of fiber are apples, broccoli,  carrots, beans, peas, and barley. Aim to get 20-30 g of fiber per day.  Eat more home-cooked food and less restaurant, buffet, and fast food.  Limit or avoid alcohol.  Limit foods high in starch and sugar.  Limit fried foods.  Cook foods using methods other than frying. Baking, boiling, grilling, and broiling are all great options.  Lose weight if you are overweight. Losing just 5-10% of your initial body weight can help your overall health and prevent diseases such as diabetes and heart disease. WHAT FOODS CAN I EAT? Grains Whole grains, such as whole wheat or whole grain breads, crackers, cereals, and pasta. Unsweetened oatmeal, bulgur, barley, quinoa, or brown rice. Corn or whole wheat flour tortillas. Vegetables Fresh or frozen vegetables (raw, steamed, roasted, or grilled). Green salads. Fruits All fresh, canned (in natural juice), or frozen fruits. Meat and Other Protein Products Ground beef (85% or leaner), grass-fed beef, or beef trimmed of fat. Skinless chicken or [place]. Ground chicken or [place]. Pork trimmed of fat. All fish and seafood. Eggs. Dried beans, peas, or lentils. Unsalted nuts or seeds. Unsalted canned or dry beans. Dairy Low-fat dairy products, such as skim or 1% milk, 2% or reduced-fat cheeses, low-fat ricotta or cottage cheese, or plain low-fat yogurt. Fats and Oils Tub margarines without trans fats. Light or reduced-fat mayonnaise and salad dressings. Avocado. Olive, canola, sesame, or safflower oils. Natural peanut or almond butter (choose ones without added sugar and oil). The items listed above may not be a complete list of recommended foods or beverages. Contact your dietitian for more options. WHAT FOODS ARE NOT RECOMMENDED? Grains White bread. White pasta. White rice. Cornbread. Bagels, pastries, and croissants. Crackers that contain trans fat. Vegetables White potatoes. Corn. Creamed or fried vegetables. Vegetables in a cheese sauce. Fruits Dried  fruits. Canned fruit in light or heavy syrup. Fruit juice. Meat and Other Protein Products Fatty cuts of meat. Ribs, chicken wings, bacon, sausage, bologna, salami, chitterlings, fatback, hot dogs, bratwurst, and packaged luncheon meats. Liver and organ meats. Dairy Whole or 2% milk, cream, half-and-half, and cream cheese. Whole milk cheeses. Whole-fat or sweetened yogurt. Full-fat cheeses. Nondairy creamers and whipped toppings. Processed cheese, cheese spreads, or cheese curds. Sweets and Desserts Corn syrup, sugars, honey, and molasses. Candy. Jam and jelly. Syrup. Sweetened cereals. Cookies, pies, cakes, donuts, muffins, and ice cream. Fats and Oils Butter, stick margarine, lard, shortening, ghee, or bacon fat. Coconut, palm kernel, or palm oils. Beverages Alcohol. Sweetened drinks (such as sodas, lemonade, and fruit drinks or punches). The items listed above may not be a  complete list of foods and beverages to avoid. Contact your dietitian for more information.   This information is not intended to replace advice given to you by your health care provider. Make sure you discuss any questions you have with your health care provider.   Document Released: 04/29/2005 Document Revised: 05/20/2014 Document Reviewed: 07/28/2013 Elsevier Interactive Patient Education [employer].

## 2015-08-28 NOTE — Addendum Note (Signed)
Addended by: Billie Lade on: 08/28/2015 02:51 PM   Modules accepted: Orders

## 2015-08-28 NOTE — Progress Notes (Signed)
Subjective: Chief Complaint  Patient presents with  . Follow-up    discuss labs. no questions yet   Here to discuss the recent abnormal labs from her physical showing new onset diabetes and low HDL.    Past Medical History  Diagnosis Date  . Allergy   . Asthma     a few prior hospitalizations, last 1992  . Diabetes mellitus without complication ([place]) [id number]  . Low HDL (under 40) 08/2015   ROS as in subjective   Objective: BP 142/90 mmHg  Pulse 90  Wt 187 lb (84.823 kg)  Gen: wd, wn, nad    Assessment: Encounter Diagnoses  Name Primary?  . Diabetes mellitus, new onset ([place]) Yes  . Low HDL (under 40)   . Obesity   . Asthma, mild persistent, uncomplicated     Plan: Diabetes - discussed the recent labs, new diagnosis of diabetes type 2, diet changes recommended, exercise, discussed pathology, possible complications, f/u, and monitoring.  Begin checking glucose fasting a few times per week.  Begin trial of Victoza for diabetes and to help with weight loss.   Other considerations are Saxenda and Metformin.   She is agreeable to Victoza trial.  Refer to nutritionist.  Low HDL - discussed diagnosis, risks for heart disease, diet recommendations  Obesity - c/t efforts at healthy diet, exercise, weight loss  Asthma - trial of Advair.  Not as good control as desired on Pulmicort.  Spent > 30 minutes face to face with patient in discussion of symptoms, evaluation, plan and recommendations.

## 2015-09-11 ENCOUNTER — Telehealth: Payer: Self-pay | Admitting: *Deleted

## 2015-09-11 HISTORY — PX: COLONOSCOPY: SHX174

## 2015-09-11 MED ORDER — PEN NEEDLES 32G X 4 MM MISC: 1.0000 | Freq: Every day | Status: DC

## 2015-09-11 NOTE — Telephone Encounter (Signed)
Pen needles sent. LM on pts VM

## 2015-09-11 NOTE — Addendum Note (Signed)
Addended by: Billie Lade on: 09/11/2015 04:52 PM   Modules accepted: Orders, Medications

## 2015-09-11 NOTE — Telephone Encounter (Signed)
Patient needs pen needled for her victoza sent to [facility] [address]-please call once sent, thanks.

## 2015-09-12 ENCOUNTER — Ambulatory Visit
Admission: RE | Admit: 2015-09-12 | Discharge: 2015-09-12 | Disposition: A | Discharge status: Home / Self Care | Payer: BC Managed Care – PPO | Source: Ambulatory Visit

## 2015-09-12 DIAGNOSIS — Z1231 Encounter for screening mammogram for malignant neoplasm of breast: Secondary | ICD-10-CM

## 2015-09-14 ENCOUNTER — Telehealth: Payer: Self-pay | Admitting: Family Medicine

## 2015-09-14 NOTE — Telephone Encounter (Signed)
Pt called for mammo results and my chart questions and asked what good diabetes numbers were.  Sending her diabetes info.

## 2015-09-18 ENCOUNTER — Ambulatory Visit (AMBULATORY_SURGERY_CENTER): Payer: Self-pay

## 2015-09-18 ENCOUNTER — Encounter: Payer: Self-pay | Admitting: Gastroenterology

## 2015-09-18 VITALS — Ht 66.5 in | Wt 178.4 lb

## 2015-09-18 DIAGNOSIS — Z1211 Encounter for screening for malignant neoplasm of colon: Secondary | ICD-10-CM

## 2015-09-18 MED ORDER — SUPREP BOWEL PREP KIT 17.5-3.13-1.6 GM/177ML PO SOLN: 1.0000 | Freq: Once | ORAL | Status: DC

## 2015-10-02 ENCOUNTER — Encounter: Payer: Self-pay | Admitting: Gastroenterology

## 2015-10-02 ENCOUNTER — Ambulatory Visit (AMBULATORY_SURGERY_CENTER): Payer: BC Managed Care – PPO | Admitting: Gastroenterology

## 2015-10-02 VITALS — BP 105/73 | HR 80 | Temp 96.6°F | Resp 16 | Ht 66.5 in | Wt 178.0 lb

## 2015-10-02 DIAGNOSIS — D120 Benign neoplasm of cecum: Secondary | ICD-10-CM

## 2015-10-02 DIAGNOSIS — D374 Neoplasm of uncertain behavior of colon: Secondary | ICD-10-CM | POA: Diagnosis not present

## 2015-10-02 DIAGNOSIS — Z1211 Encounter for screening for malignant neoplasm of colon: Secondary | ICD-10-CM

## 2015-10-02 LAB — GLUCOSE, CAPILLARY
GLUCOSE-CAPILLARY: 93 mg/dL (ref 65–99)
GLUCOSE-CAPILLARY: 74 mg/dL (ref 65–99)
Glucose-Capillary: 62 mg/dL — ABNORMAL LOW (ref 65–99)

## 2015-10-02 MED ORDER — SODIUM CHLORIDE 0.9 % IV SOLN: 500.0000 mL | INTRAVENOUS | Status: DC

## 2015-10-02 NOTE — Progress Notes (Signed)
Specimen from cecal polyp/ mass was sent [facility] per Dr. Havery Moros. maw

## 2015-10-02 NOTE — Progress Notes (Signed)
A/ox3, pleased with MAC, report to RN 

## 2015-10-02 NOTE — Progress Notes (Signed)
Called to room to assist during endoscopic procedure.  Patient ID and intended procedure confirmed with present staff. Received instructions for my participation in the procedure from the performing physician.  

## 2015-10-02 NOTE — Op Note (Signed)
[facility] Patient Name: Stacey Simon Procedure Date: 10/02/2015 1:20 PM MRN: [id number] Endoscopist: Remo Lipps P. Havery Moros , MD Age: 55 Referring MD:  Date of Birth: 01-25-1961 Gender: Female Procedure:                Colonoscopy Indications:              Screening for malignant neoplasm in the colon, This                            is the patient's first colonoscopy Medicines:                Monitored Anesthesia Care Procedure:                Pre-Anesthesia Assessment:                           - Prior to the procedure, a History and Physical                            was performed, and patient medications and                            allergies were reviewed. The patient's tolerance of                            previous anesthesia was also reviewed. The risks                            and benefits of the procedure and the sedation                            options and risks were discussed with the patient.                            All questions were answered, and informed consent                            was obtained. Prior Anticoagulants: The patient has                            taken no previous anticoagulant or antiplatelet                            agents. ASA Grade Assessment: II - A patient with                            mild systemic disease. After reviewing the risks                            and benefits, the patient was deemed in                            satisfactory condition to undergo the procedure.  After obtaining informed consent, the colonoscope                            was passed under direct vision. Throughout the                            procedure, the patient's blood pressure, pulse, and                            oxygen saturations were monitored continuously. The                            Model CF-HQ190L [id number]) scope was introduced                            through the anus and  advanced to the the cecum,                            identified by appendiceal orifice and ileocecal                            valve. The colonoscopy was performed without                            difficulty. The patient tolerated the procedure                            well. The quality of the bowel preparation was                            adequate. The ileocecal valve, appendiceal orifice,                            and rectum were photographed. Scope In: 1:33:10 PM Scope Out: 1:55:00 PM Scope Withdrawal Time: 0 hours 15 minutes 12 seconds  Total Procedure Duration: 0 hours 21 minutes 50 seconds  Findings:                 The perianal and digital rectal examinations were                            normal.                           A large polypoid lesion (few cm's at least) was                            found in the cecum, superior to the AO. The lesion                            was sessile and crossed a few folds. The central                            area of it was friable and with some erosive  changes. Polypectomy was not attempted. Superficial                            biopsies were obtained with a cold forceps for                            histology to rule out malignancy.                           Non-bleeding internal hemorrhoids were found during                            retroflexion. The hemorrhoids were small.                           The exam was otherwise without abnormality. No                            other polyps appreciated. Complications:            No immediate complications. Estimated blood loss:                            Minimal. Estimated Blood Loss:     Estimated blood loss was minimal. Impression:               - Rule out malignancy, polypoid lesion in the                            cecum. Biopsied.                           - Non-bleeding internal hemorrhoids.                           - The examination was otherwise  normal. Recommendation:           - Patient has a contact number available for                            emergencies. The signs and symptoms of potential                            delayed complications were discussed with the                            patient. Return to normal activities tomorrow.                            Written discharge instructions were provided to the                            patient.                           - Resume previous diet.                           -  Continue present medications.                           - Await pathology results.                           - Further recommendations will be made pending                            biopsy results. We will discuss modality for                            removal (repeat colonoscopy with EMR if non                            malignant) versus surgical resection, pending                            pathology results. Remo Lipps P. Armbruster, MD 10/02/2015 2:02:24 PM This report has been signed electronically.

## 2015-10-02 NOTE — Patient Instructions (Signed)

## 2015-10-03 ENCOUNTER — Telehealth: Payer: Self-pay

## 2015-10-03 NOTE — Telephone Encounter (Signed)
  Follow up Call-  Call back number 10/02/2015  Post procedure Call Back phone  # [phone]  Permission to leave phone message Yes     Patient questions:  Do you have a fever, pain , or abdominal swelling? No. Pain Score  0 *  Have you tolerated food without any problems? Yes.    Have you been able to return to your normal activities? Yes.    Do you have any questions about your discharge instructions: Diet   No. Medications  No. Follow up visit  No.  Do you have questions or concerns about your Care? No.  Actions: * If pain score is 4 or above: No action needed, pain <4.

## 2015-10-11 ENCOUNTER — Telehealth: Payer: Self-pay | Admitting: Gastroenterology

## 2015-10-12 NOTE — Telephone Encounter (Signed)
Spoke with [facility] scheduling([phone]). The MD is still reviewing records. They state patient will be contacted when scheduled and a packet will be mailed to her. Patient can call them to check on appointment scheduling. Sharl Ma is handling the records at [facility]. Left a message for patient with this information.

## 2015-10-17 ENCOUNTER — Telehealth: Payer: Self-pay | Admitting: *Deleted

## 2015-10-17 NOTE — Telephone Encounter (Signed)
Spoke with Stacey Simon at [facility] and they will be scheduling patient this week. Called and left patient a message that she should be hearing this week from [facility].

## 2015-10-17 NOTE — Telephone Encounter (Signed)
Spoke with [facility] scheduling to see if patient has been scheduled with Dr. Arsenio Loader. They will call back with update.

## 2015-10-17 NOTE — Telephone Encounter (Signed)
-----   Message from Hulan Saas, RN sent at 10/05/2015  2:52 PM EDT ----- Did patient get scheduled at [facility] with Dr. Arsenio Loader. SA.

## 2015-11-08 LAB — HM COLONOSCOPY

## 2015-11-15 ENCOUNTER — Encounter: Payer: Self-pay | Admitting: Medical

## 2015-11-17 ENCOUNTER — Other Ambulatory Visit: Payer: Self-pay | Admitting: Medical

## 2015-11-20 ENCOUNTER — Encounter: Payer: Self-pay | Admitting: Medical

## 2015-11-20 NOTE — Telephone Encounter (Signed)
Is this ok to refill?  

## 2015-11-27 ENCOUNTER — Ambulatory Visit (INDEPENDENT_AMBULATORY_CARE_PROVIDER_SITE_OTHER): Payer: BC Managed Care – PPO | Admitting: Medical

## 2015-11-27 ENCOUNTER — Encounter: Payer: Self-pay | Admitting: Medical

## 2015-11-27 VITALS — BP 110/70 | HR 74 | Wt 169.0 lb

## 2015-11-27 DIAGNOSIS — E118 Type 2 diabetes mellitus with unspecified complications: Secondary | ICD-10-CM

## 2015-11-27 DIAGNOSIS — L84 Corns and callosities: Secondary | ICD-10-CM

## 2015-11-27 DIAGNOSIS — Z860100 Personal history of colon polyps, unspecified: Secondary | ICD-10-CM

## 2015-11-27 DIAGNOSIS — J4540 Moderate persistent asthma, uncomplicated: Secondary | ICD-10-CM | POA: Diagnosis not present

## 2015-11-27 DIAGNOSIS — M21619 Bunion of unspecified foot: Secondary | ICD-10-CM | POA: Diagnosis not present

## 2015-11-27 DIAGNOSIS — R748 Abnormal levels of other serum enzymes: Secondary | ICD-10-CM

## 2015-11-27 LAB — POCT GLYCOSYLATED HEMOGLOBIN (HGB A1C): HEMOGLOBIN A1C: 6.4

## 2015-11-27 MED ORDER — DULAGLUTIDE 1.5 MG/0.5ML ~~LOC~~ SOAJ: 1.5000 mg | SUBCUTANEOUS | Status: DC

## 2015-11-27 MED ORDER — FLUTICASONE-SALMETEROL 250-50 MCG/DOSE IN AEPB: 1.0000 | INHALATION_SPRAY | Freq: Two times a day (BID) | RESPIRATORY_TRACT | Status: DC

## 2015-11-27 NOTE — Patient Instructions (Signed)
Type 2 Diabetes  Diabetes is a long-lasting (chronic) disease.  With diabetes, either the pancreas does not make enough of a hormone called insulin, or the body has trouble using the insulin that is made.  Over time, diabetes can damage the eyes, kidneys, and nerves causing retinopathy, nephropathy, and neuropathy.  Diabetes puts you at risk for heart disease and peripheral vascular disease which can lead to heart attack, stroke, foot ulcers, and amputations.    Our goal and hopefully your goal is to manage your diabetes in such a way to slow the progression of the disease and do all we can to keep you healthy  Home Care:   Eat healthy, exercise regularly, limit alcohol, and don't smoke!  Check your blood sugar (glucose) once a day before breakfast, or as indicated by our discussion today.  Take your medications daily, don't run out of medications.  Learn about low blood sugar (hypoglycemia). Know how to treat it.  Wear a necklace or bracelet that says you have diabetes.  Check your feet every night for cuts, sores, blisters, and redness. Tell your medical provider if you have problems.  Maintain a normal body weight, or normal BMI - height to weight ratio of 20-25.  Ask me about this.  GET HELP RIGHT AWAY IF:  You have trouble keeping your blood sugar in target range.  You have problems with your medicines.  You are sick and not getting better after 24 hours.  You have a sore or wound that is not healing.  You have vision problems or changes.  You have a fever.  Diet: continue healthy diet  Exercise regularly since it has beneficial effects on the heart and blood sugars. Exercising at least three times per week or 150 minutes per week can be as important as medication to a diabetic.  Find some form of exercise that you will enjoy doing regularly.  This can include walking, biking, kayaking, golfing, swimming, dance, aerobics, hiking, etc.  If you have joint problems, many  local gyms have equipment to accommodate people with specific needs.    Vaccinations:  Diabetics are at increased risk for infection, and illnesses can take longer to resolve.  Current vaccine recommendations include yearly Influenza (flu) vaccine (recommended in October), Pneumococcal vaccine, Hepatitis B vaccine series, Tdap (tetanus, diptheria and pertussis) vaccine every 10 years, and other age appropriate vaccinations.     Office visits:  We recommend routine medical care to make sure we are addressing prevention and issues as they arise.  Typically this could mean twice yearly or up to quarterly depending upon your unique health situation.  Exams should include a yearly physical, a yearly foot exam, and other examination as appropriate.  You should see an eye doctor yearly to help screen for and prevent blood vessel complications in your eyes.  Labs: Diabetics should have blood work done at least twice yearly to monitor your Hemoglobin A1C (a three-month average of your blood sugars) and your cholesterol.  You should have your urine and blood checked yearly to screen for kidney damage.  This may include creatinine and micro-albumin levels.  Other labs as appropriate.    Blood pressure goals:  Goal blood pressure in diabetics should be 130/80 or less. Monitoring your blood pressure with a home blood pressure cuff of your own is an excellent idea.  If you are prescribed medication for blood pressure, take your medication every day, and don't run out of medication.  Having high blood pressure can  damage your heart, eyes, kidneys, and put you at risk for heart attack and stroke.  Tobacco use:  If you smoke, dip or chew, quitting will reduce your risk of heart attack, stroke, peripheral vascular disease, and many cancers.    Diabetic Report Card for Augustina Ishihara  November 27, 2015  Below is a summary of recent tests related to your diabetes that can help you manage your health.    Hemoglobin A1C:  Your Hemoglobin A1C values should be less than 7. If these are greater than 7, you have a higher chance of having eye, heart, and kidney problems in the future.   Your most recent Hemoglobin A1C values were:  HgbA1C 6.4% today!  Improved  Cholesterol:  Your LDL Cholesterol (bad cholesterol) values should be less than 100 mg/dL if you do not have cardiovascular disease.  The LDL should be less than 70 mg/dL if you do have cardiovascular disease.  If your LDL is consistently higher than 100 mg/dL, then your risk of heart attack and stroke increases yearly.   Your most recent LDL Cholesterol (bad cholesterol) results were:  LDL CHOLESTEROL (mg/dL)  Date Value  07/31/2015 94     Your HDL Cholesterol (good cholesterol) values should be higher than 40 mg/dL.  If your HDL is lower than 40 mg/dL, this increases your risk of heart attack and stroke.   Your most recent HDL Cholesterol (bad cholesterol) results were:  Lipid Panel     Component Value Date/Time   CHOL 155 07/31/2015 0001   TRIG 110 07/31/2015 0001   HDL 39* 07/31/2015 0001   CHOLHDL 4.0 07/31/2015 0001   VLDL 22 07/31/2015 0001   LDLCALC 94 07/31/2015 0001       Blood Pressure:  Your blood pressure values should be less than 130/80. Please contact me if your readings at home are consistently higher than this.   Your most recent blood pressure readings at our clinic were:  BP Readings from Last 3 Encounters:  11/27/15 110/70  10/02/15 105/73  08/28/15 142/90

## 2015-11-27 NOTE — Progress Notes (Signed)
Subjective: Chief Complaint  Patient presents with  . Follow-up    3 month f/up. fasting sugar running 76-135. no problems or concerns.   Here for f/u.  Back in April she had new diagnosis of diabetes.  Taking Victoza daily.   Checking sugars most days, fasting and sometimes 2 hours after meals.   Seeing low 100s, occasion numbers in the 70s.  Eating healthy, exercising.   She notes even low 100s post prandial 2 hours after meals.  Never saw nutritionist due to scheduling conflicts.     Since last visit had 2 colonoscopies, had large polyp.  Had to see doctor in [place] at specialist.   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  Has to have repeat colonoscopy.    Asthma - using Advair, doing fine on this, not having to use albuterol much.   No other issues.  Past Medical History  Diagnosis Date  . Allergy   . Asthma     a few prior hospitalizations, last 1992  . Diabetes mellitus without complication ([place]) [id number]  . Low HDL (under 40) 08/2015  . Anemia     in college   ROS as in subjective   Objective: BP 110/70 mmHg  Pulse 74  Wt 169 lb (76.658 kg)  Wt Readings from Last 3 Encounters:  11/27/15 169 lb (76.658 kg)  10/02/15 178 lb (80.74 kg)  09/18/15 178 lb 6.4 oz (80.922 kg)   General appearance: alert, no distress, WD/WN, AA female Oral cavity: MMM, no lesions Neck: supple, no lymphadenopathy, no thyromegaly, no masses Heart: RRR, normal S1, S2, no murmurs Lungs: CTA bilaterally, no wheezes, rhonchi, or rales Ext: no edema Pulses: 2+ symmetric, upper and lower extremities, normal cap refill  Diabetic Foot Exam - Simple   Simple Foot Form  Diabetic Foot exam was performed with the following findings:  Yes 11/27/2015 10:58 AM  Visual Inspection  See comments:  Yes  Sensation Testing  Intact to touch and monofilament testing bilaterally:  Yes  Pulse Check  Posterior Tibialis and Dorsalis pulse intact  bilaterally:  Yes  Comments  Left volar foot with tender roundish patch of rough skin, bilat moderate bunions, lateral side of right toe with callous       Assessment Encounter Diagnoses  Name Primary?  . Diabetes mellitus with complication ([place]) Yes  . Low serum HDL   . Asthma, moderate persistent, uncomplicated   . Pre-ulcerative corn or callous   . Bunion of great toe   . History of colonic polyps       Plan: Hgb A1C 6.4% at goal.   discussed diagnosis of diabetes, possible complications, general recommendations, foot exams, eye exams, f/u.    Change from Victoza to Trulicity for ease of use and weekly instead of daily.  Discussed possibly adding statin next time, ASA 81mg  QHS.   C/t healthy diet, routine exercise, glucose monitoring, goals of diabetes care.   F/u with podiatry about bunion, callous.   Can try OTC bunion guard.    Asthma - doing well, c/t preventative Advair  F/u with GI regarding polyp  F/u 62mo.   Soniyah was seen today for follow-up.  Diagnoses and all orders for this visit:  Diabetes mellitus with complication ([place]) -     HM DIABETES FOOT EXAM  Low serum HDL  Asthma, moderate persistent, uncomplicated  Pre-ulcerative corn or callous  Bunion of great toe  History of colonic  polyps  Other orders -     Dulaglutide (TRULICITY) 1.5 0000000 SOPN; Inject 1.5 mg into the skin once a week. -     Fluticasone-Salmeterol (ADVAIR DISKUS) 250-50 MCG/DOSE AEPB; Inhale 1 puff into the lungs 2 (two) times daily.

## 2015-12-22 ENCOUNTER — Other Ambulatory Visit: Payer: Self-pay | Admitting: Medical

## 2016-01-11 ENCOUNTER — Emergency Department (HOSPITAL_COMMUNITY): Payer: BC Managed Care – PPO

## 2016-01-11 ENCOUNTER — Emergency Department (HOSPITAL_COMMUNITY)
Admission: EM | Admit: 2016-01-11 | Discharge: 2016-01-12 | Disposition: A | Discharge status: Home / Self Care | Payer: BC Managed Care – PPO | Attending: Emergency Medicine | Admitting: Emergency Medicine

## 2016-01-11 ENCOUNTER — Encounter (HOSPITAL_COMMUNITY): Payer: Self-pay | Admitting: Emergency Medicine

## 2016-01-11 DIAGNOSIS — J45909 Unspecified asthma, uncomplicated: Secondary | ICD-10-CM | POA: Insufficient documentation

## 2016-01-11 DIAGNOSIS — Z794 Long term (current) use of insulin: Secondary | ICD-10-CM | POA: Diagnosis not present

## 2016-01-11 DIAGNOSIS — R0789 Other chest pain: Secondary | ICD-10-CM | POA: Insufficient documentation

## 2016-01-11 DIAGNOSIS — E119 Type 2 diabetes mellitus without complications: Secondary | ICD-10-CM | POA: Diagnosis not present

## 2016-01-11 DIAGNOSIS — R079 Chest pain, unspecified: Secondary | ICD-10-CM

## 2016-01-11 LAB — CBC
HCT: 36.1 % (ref 36.0–46.0)
Hemoglobin: 11.7 g/dL — ABNORMAL LOW (ref 12.0–15.0)
MCH: 27.2 pg (ref 26.0–34.0)
MCHC: 32.4 g/dL (ref 30.0–36.0)
MCV: 84 fL (ref 78.0–100.0)
PLATELETS: 309 10*3/uL (ref 150–400)
RBC: 4.3 MIL/uL (ref 3.87–5.11)
RDW: 14.2 % (ref 11.5–15.5)
WBC: 8.2 10*3/uL (ref 4.0–10.5)

## 2016-01-11 LAB — BASIC METABOLIC PANEL
Anion gap: 6 (ref 5–15)
BUN: 10 mg/dL (ref 6–20)
CHLORIDE: 105 mmol/L (ref 101–111)
CO2: 27 mmol/L (ref 22–32)
CREATININE: 0.86 mg/dL (ref 0.44–1.00)
Calcium: 9 mg/dL (ref 8.9–10.3)
GFR calc Af Amer: >60 mL/min (ref >60)
GFR calc non Af Amer: >60 mL/min (ref >60)
Glucose, Bld: 185 mg/dL — ABNORMAL HIGH (ref 65–99)
Potassium: 3.4 mmol/L — ABNORMAL LOW (ref 3.5–5.1)
Sodium: 138 mmol/L (ref 135–145)

## 2016-01-11 LAB — I-STAT TROPONIN, ED: Troponin i, poc: 0 ng/mL (ref 0.00–0.08)

## 2016-01-11 NOTE — ED Triage Notes (Signed)
Per EMS, pt began to experience sudden onset centeralized non-radiating chest pain around 2115.  Prior to that she has two loose stools and had begun to have an "upset stomach".  She reported that it was difficult to take a deep breath so she used her inhaler (for asthma.) Pain was rated at 10/10, was given 324 ASA and 2 nitro which decreased to 4/10.  Hx of asthma and diabetes.

## 2016-01-11 NOTE — ED Provider Notes (Signed)
[facility] DEPT Provider Note   CSN: [id number] Arrival date & time: 01/11/16  2249  By signing my name below, I, Dora Sims, attest that this documentation has been prepared under the direction and in the presence of physician practitioner, Orpah Greek, MD. Electronically Signed: Dora Sims, Scribe. 01/11/2016. 11:22 PM.  History   Chief Complaint Chief Complaint  Patient presents with  . Chest Pain    The history is provided by the patient. No language interpreter was used.     HPI Comments: Stefanny Winkowski is a 55 y.o. female brought in by EMS, with PMHx of DM and asthma, who presents to the Emergency Department complaining of sudden onset, constant, gradually improving, chest tightness beginning around 9 PM tonight. Pt reports she had a BM tonight, laid down, had another BM several minutes later, and experienced chest tightness. She reports she used her inhaler with no relief and her tightness worsened. Pt reports her BM's were diarrhea. Pt endorses associated cramping abdominal pain, chest pain, and SOB, all of which have gradually improved. She states her chest tightness did not feel like asthma; she states she wheezes with asthma but denies wheezing tonight. Pt reports she has experienced chest tightness in the past, but not as severe. She denies nausea, vomiting, or any other associated symptoms.  Past Medical History:  Diagnosis Date  . Allergy   . Anemia    in college  . Asthma    a few prior hospitalizations, last 1992  . Diabetes mellitus without complication ([place]) Q000111Q  . Low HDL (under 40) 08/2015    Patient Active Problem List   Diagnosis Date Noted  . Diabetes mellitus with complication ([place]) 123456  . Low serum HDL 11/27/2015  . History of colonic polyps 11/27/2015  . Bunion of great toe 11/27/2015  . Pre-ulcerative corn or callous 11/27/2015  . Asthma, moderate persistent 06/28/2015  . Rhinitis, allergic 06/28/2015  .  Insomnia 06/28/2015  . Influenza vaccination declined 06/28/2015    Past Surgical History:  Procedure Laterality Date  . CESAREAN SECTION    . CYST EXCISION     left volar foot  . UMBILICAL HERNIA REPAIR      OB History    No data available       Home Medications    Prior to Admission medications   Medication Sig Start Date End Date Taking? Authorizing Provider  Dulaglutide (TRULICITY) 1.5 0000000 SOPN Inject 1.5 mg into the skin once a week. 11/27/15   Camelia Eng Tysinger, PA-C  Fluticasone-Salmeterol (ADVAIR DISKUS) 250-50 MCG/DOSE AEPB Inhale 1 puff into the lungs 2 (two) times daily. 11/27/15   Camelia Eng Tysinger, PA-C  Insulin Pen Needle (PEN NEEDLES) 32G X 4 MM MISC 1 each by Does not apply route daily. 09/11/15   Camelia Eng Tysinger, PA-C  PROAIR HFA 108 (90 Base) MCG/ACT inhaler INHALE 1-2 PUFFS INTO THE LUNGS EVERY 6 HOURS AS NEEDED FOR WHEEZING OR SHORTNESS OF BREATH. Patient not taking: Reported on 11/27/2015 11/20/15   Carlena Hurl, PA-C    Family History Family History  Problem Relation Age of Onset  . Asthma Mother   . Hypertension Mother   . Asthma Sister   . Asthma Daughter   . Asthma Son   . Asthma Brother   . Hypertension Maternal Grandmother   . Hypertension Paternal Grandmother   . Hypertension Paternal Grandfather   . Stroke Paternal Grandfather   . Cancer Maternal Aunt     breast  .  Colon cancer Neg Hx     Social History Social History  Substance Use Topics  . Smoking status: Never Smoker  . Smokeless tobacco: Never Used  . Alcohol use No     Allergies   Review of patient's allergies indicates no known allergies.   Review of Systems Review of Systems  Respiratory: Positive for chest tightness and shortness of breath. Negative for wheezing.   Cardiovascular: Positive for chest pain.  Gastrointestinal: Positive for abdominal pain and diarrhea. Negative for nausea and vomiting.  All other systems reviewed and are negative.   Physical  Exam Updated Vital Signs BP 134/73 (BP Location: Left Arm)   Pulse 86   Temp 98.4 F (36.9 C) (Oral)   Resp 16   Ht 5' 6.5" (1.689 m)   Wt 169 lb (76.7 kg)   SpO2 97%   BMI 26.87 kg/m   Physical Exam  Constitutional: She is oriented to person, place, and time. She appears well-developed and well-nourished. No distress.  HENT:  Head: Normocephalic and atraumatic.  Right Ear: Hearing normal.  Left Ear: Hearing normal.  Nose: Nose normal.  Mouth/Throat: Oropharynx is clear and moist and mucous membranes are normal.  Eyes: Conjunctivae and EOM are normal. Pupils are equal, round, and reactive to light.  Neck: Normal range of motion. Neck supple.  Cardiovascular: Regular rhythm, S1 normal and S2 normal.  Exam reveals no gallop and no friction rub.   No murmur heard. Pulmonary/Chest: Effort normal and breath sounds normal. No respiratory distress. She exhibits no tenderness.  Abdominal: Soft. Normal appearance and bowel sounds are normal. There is no hepatosplenomegaly. There is no tenderness. There is no rebound, no guarding, no tenderness at McBurney's point and negative Murphy's sign. No hernia.  Musculoskeletal: Normal range of motion.  Neurological: She is alert and oriented to person, place, and time. She has normal strength. No cranial nerve deficit or sensory deficit. Coordination normal. GCS eye subscore is 4. GCS verbal subscore is 5. GCS motor subscore is 6.  Skin: Skin is warm, dry and intact. No rash noted. No cyanosis.  Psychiatric: She has a normal mood and affect. Her speech is normal and behavior is normal. Thought content normal.  Nursing note and vitals reviewed.  ED Treatments / Results  Labs (all labs ordered are listed, but only abnormal results are displayed) [place], ED    EKG  EKG Interpretation None       Radiology No results found.  Procedures Procedures (including critical care  time)  DIAGNOSTIC STUDIES: Oxygen Saturation is 97% on RA, normal by my interpretation.    COORDINATION OF CARE: 11:22 PM Discussed treatment plan with pt at bedside and pt agreed to plan.  Medications Ordered in ED Medications - No data to display   Initial Impression / Assessment and Plan / ED Course  I have reviewed the triage vital signs and the nursing notes.  Pertinent labs & imaging results that were available during my care of the patient were reviewed by me and considered in my medical decision making (see chart for details).  Clinical Course    Patient presented to the ER for evaluation of chest pain. She reports that she was experiencing mostly tightness across her chest with mild shortness of breath. Symptoms came on somewhat suddenly at rest. She has not had any previous cardiac history. Patient thought she might be having asthma, used her inhaler but it did not help. She  does not endorse any exertional component. She did not identify any alleviating or exacerbating factor. Initial EKG did not show evidence of ischemia. Troponin negative. CT angiography performed to further evaluate, no PE noted. Second troponin performed and is negative. At this point, patient is felt to be low risk for cardiac etiology, or purpura for discharge and prompt follow-up with primary doctor.  I personally performed the services described in this documentation, which was scribed in my presence. The recorded information has been reviewed and is accurate.   Final Clinical Impressions(s) / ED Diagnoses   Final diagnoses:  None  chest tightness   New Prescriptions New Prescriptions   No medications on file     Orpah Greek, MD 01/12/16 [phone]

## 2016-01-12 ENCOUNTER — Emergency Department (HOSPITAL_COMMUNITY): Payer: BC Managed Care – PPO

## 2016-01-12 LAB — I-STAT TROPONIN, ED: Troponin i, poc: 0 ng/mL (ref 0.00–0.08)

## 2016-01-12 MED ORDER — IOPAMIDOL (ISOVUE-370) INJECTION 76%
INTRAVENOUS | Status: AC
  Administered 2016-01-12: 02:00:00
  Filled 2016-01-12: qty 100

## 2016-01-12 MED ORDER — TRAMADOL HCL 50 MG PO TABS: 50.0000 mg | ORAL_TABLET | Freq: Four times a day (QID) | ORAL | 0 refills | Status: DC | PRN

## 2016-01-12 MED ORDER — AMOXICILLIN 500 MG PO CAPS: 500.0000 mg | ORAL_CAPSULE | Freq: Three times a day (TID) | ORAL | 0 refills | Status: DC

## 2016-01-12 NOTE — ED Notes (Signed)
Patient transported to CT 

## 2016-01-19 ENCOUNTER — Telehealth: Payer: Self-pay | Admitting: Medical

## 2016-01-19 NOTE — Telephone Encounter (Signed)
Pt called and stated that she would like to be placed back on Victoza. She states that while she has been on Trulicity she has had issues with chest tightness and she believes it is coming form that medication. Pt can be reached at [phone] and uses [facility] [address]

## 2016-01-21 ENCOUNTER — Other Ambulatory Visit: Payer: Self-pay | Admitting: Medical

## 2016-01-22 ENCOUNTER — Other Ambulatory Visit: Payer: Self-pay | Admitting: Medical

## 2016-01-22 MED ORDER — LIRAGLUTIDE 18 MG/3ML ~~LOC~~ SOPN: 1.8000 mg | PEN_INJECTOR | Freq: Every day | SUBCUTANEOUS | 2 refills | Status: DC

## 2016-01-22 NOTE — Telephone Encounter (Signed)
Left message for pt to call. Needs an appt per Audelia Acton.

## 2016-01-22 NOTE — Telephone Encounter (Signed)
I sent it , but get her back in for f/u and discuss chest pain to make sure nothing else causing this

## 2016-01-24 ENCOUNTER — Ambulatory Visit (INDEPENDENT_AMBULATORY_CARE_PROVIDER_SITE_OTHER): Payer: BC Managed Care – PPO | Admitting: Medical

## 2016-01-24 ENCOUNTER — Encounter: Payer: Self-pay | Admitting: Medical

## 2016-01-24 VITALS — BP 110/70 | HR 64 | Resp 12 | Ht 66.5 in | Wt 174.0 lb

## 2016-01-24 DIAGNOSIS — Z23 Encounter for immunization: Secondary | ICD-10-CM

## 2016-01-24 DIAGNOSIS — Z91018 Allergy to other foods: Secondary | ICD-10-CM

## 2016-01-24 DIAGNOSIS — E118 Type 2 diabetes mellitus with unspecified complications: Secondary | ICD-10-CM

## 2016-01-24 DIAGNOSIS — J4540 Moderate persistent asthma, uncomplicated: Secondary | ICD-10-CM

## 2016-01-24 DIAGNOSIS — J309 Allergic rhinitis, unspecified: Secondary | ICD-10-CM | POA: Diagnosis not present

## 2016-01-24 DIAGNOSIS — R0789 Other chest pain: Secondary | ICD-10-CM

## 2016-01-24 NOTE — Progress Notes (Signed)
Subjective: Chief Complaint  Patient presents with  . Follow-up    ER chest tightness    Here for ED visit f/u.     Few weeks ago had stomach churning right after bowel movement. Had to go back for second BM.   Shortly thereafter, had sudden chest tightness.  Had never had anything like this before.   Used the albuterol and this didn't help.  EMS came, gave her NTG under tongue, but this didn't help  However, oxygen did help.   When she left ED felt better.  Since the ED visit hasn't had any more symptoms.   Leading up to the day she went to the ED, had not been having wheezing, SOB.   In general doesn't think her asthma has been an issue of late on Advair, but sometimes when eating fish gets wheezing.  She had allergy testing years ago when she came to [facility] and had many allergies to dust, pollen, trees, several others.   She doesn't think she was stressed the day of the ED visit. The day she went to the ED, this was her last day of work, retirement day!  Using Advair daily, no recent albuterol use in last few days.    As a side note, we recently switched back to Victoza at her request.   tolerated this better than Trulicity.   Not on BP or cholesterol medication.  Past Medical History:  Diagnosis Date  . Allergy   . Anemia    in college  . Asthma    a few prior hospitalizations, last 1992  . Diabetes mellitus without complication ([place]) [id number]  . Low HDL (under 40) 08/2015   Past Surgical History:  Procedure Laterality Date  . CESAREAN SECTION    . CYST EXCISION     left volar foot  . UMBILICAL HERNIA REPAIR      ROS as in subjective  Objective: BP 110/70   Pulse 64   Resp 12   Ht 5' 6.5" (1.689 m)   Wt 174 lb (78.9 kg)   BMI 27.66 kg/m   General appearance: Alert, WD/WN, no distress                             Skin: warm, no rash                           Head: no sinus tenderness                            Eyes: conjunctiva normal, corneas clear, PERRLA               Ears: pearly TMs, external ear canals normal                          Nose: septum midline, turbinates swollen, with erythema and clear discharge             Mouth/throat: MMM, tongue normal, mild pharyngeal erythema                           Neck: supple, no adenopathy, no thyromegaly, non tender                          Heart: RRR, normal S1, S2,  no murmurs                         Lungs: CTA bilaterally, no wheezes, rales, or rhonchi   Ext: no edema Pulses normal Abdomen: +bs, soft, nontender, no mass, no organomegaly    Assessment: Encounter Diagnoses  Name Primary?  Marland Kitchen Asthma, moderate persistent, uncomplicated Yes  . Allergic rhinitis, unspecified allergic rhinitis type   . Chest tightness   . Need for prophylactic vaccination and inoculation against influenza   . 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 Diabetes mellitus with complication [facility])     Plan: Reviewed her recent ED visit, EKG, chest CT from 01/12/2016 visit.  Potassium and hemoglobin slightly low, otherwise no major cause of her chest tightness found.     PFT done today and reviewed, abnormal.   Advised she begin daily OTC antihistamine, c/t Advair, Proair.  Call report on symptoms in 2-3 weeks.  Will get food allergy lab  Stacey Simon was seen today for follow-up.  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

## 2016-01-25 LAB — FOOD ALLERGY PANEL
CORN: 0.52 kU/L — AB
Clams: <0.1 kU/L
Egg White IgE: <0.1 kU/L
FISH COD: 0.17 kU/L — AB
Milk IgE: <0.1 kU/L
Peanut IgE: 0.43 kU/L — ABNORMAL HIGH
SOYBEAN IGE: 0.21 kU/L — AB
WHEAT IGE: 0.53 kU/L — AB
Walnut: 0.45 kU/L — ABNORMAL HIGH

## 2016-04-10 ENCOUNTER — Other Ambulatory Visit: Payer: Self-pay

## 2016-04-10 DIAGNOSIS — R062 Wheezing: Secondary | ICD-10-CM

## 2016-04-10 MED ORDER — ALBUTEROL SULFATE HFA 108 (90 BASE) MCG/ACT IN AERS: 2.0000 | INHALATION_SPRAY | Freq: Four times a day (QID) | RESPIRATORY_TRACT | 0 refills | Status: DC | PRN

## 2016-05-02 ENCOUNTER — Other Ambulatory Visit (INDEPENDENT_AMBULATORY_CARE_PROVIDER_SITE_OTHER): Payer: BC Managed Care – PPO

## 2016-05-02 DIAGNOSIS — Z23 Encounter for immunization: Secondary | ICD-10-CM

## 2016-05-13 HISTORY — PX: COLONOSCOPY: SHX174

## 2016-05-17 ENCOUNTER — Other Ambulatory Visit: Payer: Self-pay | Admitting: Medical

## 2016-05-17 ENCOUNTER — Telehealth: Payer: Self-pay | Admitting: Medical

## 2016-05-17 NOTE — Telephone Encounter (Signed)
Rcvd fax from pharmacy stating that they noticed that pt has not filled a statin therapy. Pharmacy requesting that a script for statin therapy be sent in, if appropriate.

## 2016-05-17 NOTE — Telephone Encounter (Signed)
We can discuss next visit

## 2016-05-19 ENCOUNTER — Other Ambulatory Visit: Payer: Self-pay | Admitting: Medical

## 2016-05-30 ENCOUNTER — Ambulatory Visit: Payer: BC Managed Care – PPO | Admitting: Medical

## 2016-06-03 ENCOUNTER — Encounter: Payer: Self-pay | Admitting: Medical

## 2016-06-03 ENCOUNTER — Ambulatory Visit (INDEPENDENT_AMBULATORY_CARE_PROVIDER_SITE_OTHER): Payer: BC Managed Care – PPO | Admitting: Medical

## 2016-06-03 ENCOUNTER — Telehealth: Payer: Self-pay | Admitting: Gastroenterology

## 2016-06-03 VITALS — BP 132/68 | HR 71 | Wt 180.0 lb

## 2016-06-03 DIAGNOSIS — R748 Abnormal levels of other serum enzymes: Secondary | ICD-10-CM

## 2016-06-03 DIAGNOSIS — R062 Wheezing: Secondary | ICD-10-CM | POA: Diagnosis not present

## 2016-06-03 DIAGNOSIS — E118 Type 2 diabetes mellitus with unspecified complications: Secondary | ICD-10-CM

## 2016-06-03 DIAGNOSIS — J4540 Moderate persistent asthma, uncomplicated: Secondary | ICD-10-CM

## 2016-06-03 LAB — LIPID PANEL
Cholesterol: 194 mg/dL (ref <200)
HDL: 42 mg/dL — AB (ref >50)
LDL CALC: 110 mg/dL — AB (ref <100)
Total CHOL/HDL Ratio: 4.6 Ratio (ref <5.0)
Triglycerides: 208 mg/dL — ABNORMAL HIGH (ref <150)
VLDL: 42 mg/dL — ABNORMAL HIGH (ref <30)

## 2016-06-03 LAB — COMPREHENSIVE METABOLIC PANEL
ALBUMIN: 4 g/dL (ref 3.6–5.1)
ALT: 20 U/L (ref 6–29)
AST: 21 U/L (ref 10–35)
Alkaline Phosphatase: 65 U/L (ref 33–130)
BILIRUBIN TOTAL: 0.8 mg/dL (ref 0.2–1.2)
BUN: 11 mg/dL (ref 7–25)
CHLORIDE: 105 mmol/L (ref 98–110)
CO2: 26 mmol/L (ref 20–31)
CREATININE: 0.85 mg/dL (ref 0.50–1.05)
Calcium: 9.8 mg/dL (ref 8.6–10.4)
Glucose, Bld: 129 mg/dL — ABNORMAL HIGH (ref 65–99)
Potassium: 4.5 mmol/L (ref 3.5–5.3)
SODIUM: 140 mmol/L (ref 135–146)
TOTAL PROTEIN: 7.4 g/dL (ref 6.1–8.1)

## 2016-06-03 LAB — HEMOGLOBIN A1C
HEMOGLOBIN A1C: 6.2 % — AB (ref <5.7)
MEAN PLASMA GLUCOSE: 131 mg/dL

## 2016-06-03 MED ORDER — ALBUTEROL SULFATE HFA 108 (90 BASE) MCG/ACT IN AERS: 2.0000 | INHALATION_SPRAY | Freq: Four times a day (QID) | RESPIRATORY_TRACT | 1 refills | Status: DC | PRN

## 2016-06-03 NOTE — Progress Notes (Signed)
Subjective: Chief Complaint  Patient presents with  . follow up    6 month follow asthma, diabetes   Here for f/u on asthma, diabetes, low HDL.   Doing well.   Exercising regularly, eating healthy.  Retired in August, been enjoying retirement.   Been doing some volunteering.  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   volunteering at high and middle school lately.     Asthma - using Advair, forgets evening dose at times.  Wants to use Ventolin instead of proair, feels like ventolin works better.   Using victoza daily but had run out for period of time given changes in insurance.   Checking sugars some.   No foot issues.  No other issues.  Past Medical History:  Diagnosis Date  . Allergy   . Anemia    in college  . Asthma    a few prior hospitalizations, last 1992  . Diabetes mellitus without complication ([place]) Q000111Q  . Low HDL (under 40) 08/2015   Current Outpatient Prescriptions on File Prior to Visit  Medication Sig Dispense Refill  . Fluticasone-Salmeterol (ADVAIR DISKUS) 250-50 MCG/DOSE AEPB Inhale 1 puff into the lungs 2 (two) times daily. 60 each 11  . Insulin Pen Needle (PEN NEEDLES) 32G X 4 MM MISC 1 each by Does not apply route daily. 100 each 5  . Liraglutide (VICTOZA) 18 MG/3ML SOPN Inject 0.3 mLs (1.8 mg total) into the skin daily. 6 mL 2   No current facility-administered medications on file prior to visit.    ROS as in subjective    Objective: BP 132/68   Pulse 71   Wt 180 lb (81.6 kg)   SpO2 96%   BMI 28.62 kg/m   General appearance: alert, no distress, WD/WN Oral cavity: MMM, no lesions Neck: supple, no lymphadenopathy, no thyromegaly, no masses Heart: RRR, normal S1, S2, no murmurs Lungs: CTA bilaterally, no wheezes, rhonchi, or rales Abdomen: +bs, soft, non tender, non distended, no masses, no hepatomegaly, no splenomegaly Pulses: 2+ symmetric, upper and lower extremities, normal cap refill Ext: no  edema    Assessment: Encounter Diagnoses  Name Primary?  . Diabetes mellitus with complication ([place]) Yes  . Low serum HDL   . Moderate persistent asthma without complication   . Wheezing symptom      Plan Diabetes - maintaining healthy lifestyle.   Labs today.  C/t victoza.  C/t daily foot checks, monitoring glucose more frequently than she is doing. Asthma - consider alternate medications such as daily inhaler instead of BID.  Has done fine on Advair.  C/t ventolin resource inhaler prn.  Avoid triggers Low serum HDL - repeat labs today C/t routine exercise, health diet.    Stacey Simon was seen today for follow up.  Diagnoses and all orders for this visit:  Diabetes mellitus with complication ([place]) -     Hemoglobin A1c -     Lipid panel -     Comprehensive metabolic panel  Low serum HDL -     Hemoglobin A1c -     Lipid panel -     Comprehensive metabolic panel  Moderate persistent asthma without complication  Wheezing symptom -     albuterol (PROVENTIL HFA;VENTOLIN HFA) 108 (90 Base) MCG/ACT inhaler; Inhale 2 puffs into the lungs every 6 (six) hours as needed for wheezing or shortness of breath.

## 2016-06-03 NOTE — Telephone Encounter (Signed)
Thanks

## 2016-06-03 NOTE — Telephone Encounter (Signed)
Recall put in for Jan 2019.

## 2016-06-03 NOTE — Telephone Encounter (Signed)
Records came in from [facility]. She had a colonoscopy there with EMR of large cecal polyp, c/w TVA. She had a follow up colonoscopy this January 2018 showing residual small fragment of TVA, no high grade dysplasia. Dr. Lynita Lombard recommends a one year follow up colonoscopy in January 2019 which we can take care of for her. Can you please place recall colonoscopy for January 2019? Thanks

## 2016-06-04 ENCOUNTER — Other Ambulatory Visit: Payer: Self-pay | Admitting: Medical

## 2016-06-05 ENCOUNTER — Other Ambulatory Visit: Payer: Self-pay | Admitting: Medical

## 2016-06-05 MED ORDER — PRAVASTATIN SODIUM 20 MG PO TABS: 20.0000 mg | ORAL_TABLET | Freq: Every day | ORAL | 0 refills | Status: DC

## 2016-06-06 ENCOUNTER — Other Ambulatory Visit: Payer: Self-pay | Admitting: Medical

## 2016-06-06 MED ORDER — LIRAGLUTIDE 18 MG/3ML ~~LOC~~ SOPN: 1.8000 mg | PEN_INJECTOR | Freq: Every day | SUBCUTANEOUS | 5 refills | Status: DC

## 2016-06-06 MED ORDER — PEN NEEDLES 32G X 4 MM MISC: 1.0000 | Freq: Every day | 5 refills | Status: DC

## 2016-06-06 MED ORDER — FLUTICASONE-SALMETEROL 250-50 MCG/DOSE IN AEPB: 1.0000 | INHALATION_SPRAY | Freq: Two times a day (BID) | RESPIRATORY_TRACT | 5 refills | Status: DC

## 2016-06-17 ENCOUNTER — Telehealth: Payer: Self-pay | Admitting: Medical

## 2016-06-17 ENCOUNTER — Other Ambulatory Visit: Payer: Self-pay | Admitting: Medical

## 2016-06-17 MED ORDER — ALBUTEROL SULFATE 108 (90 BASE) MCG/ACT IN AEPB: 2.0000 | INHALATION_SPRAY | Freq: Two times a day (BID) | RESPIRATORY_TRACT | 1 refills | Status: DC

## 2016-06-17 NOTE — Telephone Encounter (Signed)
Recv'd fax that Ventolin not covered must use generic Levalbuterol tartrateor or Proair.  Called pt & she is ok switching to [employer], she states she prefers Ventolin but is ok switching to what her insurance will pay for.

## 2016-06-17 NOTE — Telephone Encounter (Signed)
proair sent

## 2016-09-05 ENCOUNTER — Other Ambulatory Visit: Payer: Self-pay | Admitting: Medical

## 2016-10-18 ENCOUNTER — Telehealth: Payer: Self-pay | Admitting: Medical

## 2016-10-18 NOTE — Telephone Encounter (Signed)
Called pt to let her know that we don't have any samples of the proair,

## 2016-10-18 NOTE — Telephone Encounter (Signed)
Pt called for sample of proair. Her husband has an appt this afternoon. If ok please give to him while he is here.

## 2016-10-21 ENCOUNTER — Other Ambulatory Visit: Payer: Self-pay | Admitting: Medical

## 2016-10-21 MED ORDER — ALBUTEROL SULFATE HFA 108 (90 BASE) MCG/ACT IN AERS: 2.0000 | INHALATION_SPRAY | Freq: Four times a day (QID) | RESPIRATORY_TRACT | 1 refills | Status: DC | PRN

## 2016-10-21 NOTE — Telephone Encounter (Signed)
Rx sent for abluterol

## 2016-10-21 NOTE — Telephone Encounter (Signed)
ok 

## 2017-02-11 ENCOUNTER — Ambulatory Visit: Payer: BC Managed Care – PPO | Admitting: Family Medicine

## 2017-02-12 ENCOUNTER — Encounter: Payer: Self-pay | Admitting: Family Medicine

## 2017-02-12 ENCOUNTER — Ambulatory Visit (INDEPENDENT_AMBULATORY_CARE_PROVIDER_SITE_OTHER): Payer: BC Managed Care – PPO | Admitting: Family Medicine

## 2017-02-12 VITALS — BP 124/70 | HR 65 | Temp 98.0°F | Wt 185.5 lb

## 2017-02-12 DIAGNOSIS — R609 Edema, unspecified: Secondary | ICD-10-CM

## 2017-02-12 MED ORDER — PRAVASTATIN SODIUM 20 MG PO TABS: 20.0000 mg | ORAL_TABLET | Freq: Every day | ORAL | 0 refills | Status: DC

## 2017-02-12 NOTE — Patient Instructions (Addendum)
Your swelling is intermittent. You can try compression stockings if it returns and eating a low salt diet.  Elevate your legs also when you can.    Peripheral Edema Peripheral edema is swelling that is caused by a buildup of fluid. Peripheral edema most often affects the lower legs, ankles, and feet. It can also develop in the arms, hands, and face. The area of the body that has peripheral edema will look swollen. It may also feel heavy or warm. Your clothes may start to feel tight. Pressing on the area may make a temporary dent in your skin. You may not be able to move your arm or leg as much as usual. There are many causes of peripheral edema. It can be a complication of other diseases, such as congestive heart failure, kidney disease, or a problem with your blood circulation. It also can be a side effect of certain medicines. It often happens to women during pregnancy. Sometimes, the cause is not known. Treating the underlying condition is often the only treatment for peripheral edema. Follow these instructions at home: Pay attention to any changes in your symptoms. Take these actions to help with your discomfort:  Raise (elevate) your legs while you are sitting or lying down.  Move around often to prevent stiffness and to lessen swelling. Do not sit or stand for long periods of time.  Wear support stockings as told by your health care provider.  Follow instructions from your health care provider about limiting salt (sodium) in your diet. Sometimes eating less salt can reduce swelling.  Take over-the-counter and prescription medicines only as told by your health care provider. Your health care provider may prescribe medicine to help your body get rid of excess water (diuretic).  Keep all follow-up visits as told by your health care provider. This is important.  Contact a health care provider if:  You have a fever.  Your edema starts suddenly or is getting worse, especially if you are  pregnant or have a medical condition.  You have swelling in only one leg.  You have increased swelling and pain in your legs. Get help right away if:  You develop shortness of breath, especially when you are lying down.  You have pain in your chest or abdomen.  You feel weak.  You faint. This information is not intended to replace advice given to you by your health care provider. Make sure you discuss any questions you have with your health care provider. Document Released: 06/06/2004 Document Revised: 10/02/2015 Document Reviewed: 11/09/2014 Elsevier Interactive Patient Education  [employer].

## 2017-02-12 NOTE — Progress Notes (Signed)
   Subjective:    Patient ID: Stacey Simon, female    DOB: [birth year], 58 y.o.   MRN: [id number]  HPI Chief Complaint  Patient presents with  . swelling in feet    swelling in feet. been going on about a week, [facility] pt was advised to follow-up with shane for multiple issues   She is here with complaints of intermittent swelling in her feet for the past week. Swelling is intermittent and improves after elevating her legs. 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 This is usually when her swelling is worse.  Denies swelling today. No recent significant weight gain.  Denies fever, chills, dizziness, chest pain, palpitations, orthopnea,shortness of breath, cough, abdominal pain, N/V/D.    States she has a history of surgery in her left foot and has an appointment with her podiatrist next week.  Denies increase in salt intake.   Reviewed allergies, medications, past medical, surgical, and social history.  Review of Systems Pertinent positives and negatives in the history of present illness.     Objective:   Physical Exam BP 124/70   Pulse 65   Temp 98 F (36.7 C) (Oral)   Wt 185 lb 8 oz (84.1 kg)   BMI 29.49 kg/m  Alert and in no distress. Cardiac exam shows a regular sinus rhythm without murmurs or gallops. Lungs are clear to auscultation and normal work of breathing. Extremities without edema, normal pulses. Bilateral LE with normal ROM. Normal gait. Skin is warm and dry, no pallor or rash.       Assessment & Plan:  Peripheral edema  She has no edema today. Discussed possible etiologies and recommend that she elevate her feet when possible, pay attention to salt intake and try compression stockings if needed.  She will follow up with her PCP, Audelia Acton soon for chronic health issues.

## 2017-02-19 ENCOUNTER — Encounter: Payer: Self-pay | Admitting: Medical

## 2017-02-19 ENCOUNTER — Ambulatory Visit (INDEPENDENT_AMBULATORY_CARE_PROVIDER_SITE_OTHER): Payer: BC Managed Care – PPO | Admitting: Medical

## 2017-02-19 VITALS — BP 114/72 | HR 67 | Temp 97.8°F | Wt 185.2 lb

## 2017-02-19 DIAGNOSIS — R609 Edema, unspecified: Secondary | ICD-10-CM | POA: Insufficient documentation

## 2017-02-19 DIAGNOSIS — R748 Abnormal levels of other serum enzymes: Secondary | ICD-10-CM | POA: Diagnosis not present

## 2017-02-19 DIAGNOSIS — E118 Type 2 diabetes mellitus with unspecified complications: Secondary | ICD-10-CM

## 2017-02-19 DIAGNOSIS — N898 Other specified noninflammatory disorders of vagina: Secondary | ICD-10-CM

## 2017-02-19 DIAGNOSIS — Z23 Encounter for immunization: Secondary | ICD-10-CM

## 2017-02-19 LAB — POCT WET PREP (WET MOUNT)

## 2017-02-19 MED ORDER — FLUCONAZOLE 150 MG PO TABS: ORAL_TABLET | ORAL | 0 refills | Status: DC

## 2017-02-19 NOTE — Progress Notes (Signed)
Subjective:  Stacey Simon is a 56 y.o. female who presents for f/u.  She saw Vickie NP here recently for edema, but the next day her swelling was improved.  She never started compression hose.  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    She is not currently exercising  She tries to eat healthy  She has f/u with podiatry tomorrow  She is compliant with statin.   She is not checking sugars or BPs.   Denies  SOB, no chest pain  She has concerns for yeast infection.  Been having vaginal itching and discharge, but no dysuria, no vaginal or urinary bleeding, no abdominal or back pain.  She bought monistat but hasn't started this.  Married, no concern for STD.  Here for Flu shot  No other c/o.  The following portions of the patient's history were reviewed and updated as appropriate: allergies, current medications, past family history, past medical history, past social history, past surgical history and problem list.  ROS Otherwise as in subjective above  Past Medical History:  Diagnosis Date  . Allergy   . Anemia    in college  . Asthma    a few prior hospitalizations, last 1992  . Diabetes mellitus without complication ([place]) 0/3009  . Low HDL (under 40) 08/2015   Current Outpatient Prescriptions on File Prior to Visit  Medication Sig Dispense Refill  . albuterol (PROVENTIL HFA;VENTOLIN HFA) 108 (90 Base) MCG/ACT inhaler Inhale 2 puffs into the lungs every 6 (six) hours as needed for wheezing or shortness of breath. 1 Inhaler 1  . Fluticasone-Salmeterol (ADVAIR DISKUS) 250-50 MCG/DOSE AEPB Inhale 1 puff into the lungs 2 (two) times daily. 60 each 5  . pravastatin (PRAVACHOL) 20 MG tablet Take 1 tablet (20 mg total) by mouth daily. 30 tablet 0   No current facility-administered medications on file prior to visit.     Objective:Exam BP 114/72   Pulse 67   Temp 97.8 F (36.6 C)    Wt 185 lb 3.2 oz (84 kg)   SpO2 96%   BMI 29.44 kg/m   General appearance: alert, no distress, WD/WN Neck: supple, no lymphadenopathy, no thyromegaly, no masses Heart: RRR, normal S1, S2, no murmurs Lungs: CTA bilaterally, no wheezes, rhonchi, or rales Abdomen: +bs, soft, non tender, non distended, no masses, no hepatomegaly, no splenomegaly Back: non tender Pulses: 2+ radial pulses, 1+ pedal pulses, normal cap refill Ext: no edema   Assessment: Encounter Diagnoses  Name Primary?  . Diabetes mellitus with complication ([place]) Yes  . Low serum HDL   . Vaginal discharge   . Need for influenza vaccination   . Edema, unspecified type     Plan: Diabetes - discussed need for f/u q3-6 mo not yearly.  Labs today.  Advised daily foot checks, yearly eye doctor f/u.  Counseled on diet, exercise  Abnormal lipids - labs today, c/t Pravachol  Vaginal discharge: + yeast on KOH prep, begin Diflucan  Counseled on the influenza virus vaccine.  Vaccine information sheet given.  Influenza vaccine given after consent obtained.  Follow up: pending labs

## 2017-02-20 ENCOUNTER — Other Ambulatory Visit: Payer: Self-pay | Admitting: Medical

## 2017-02-20 LAB — COMPREHENSIVE METABOLIC PANEL
AG Ratio: 1.2 (calc) (ref 1.0–2.5)
ALBUMIN MSPROF: 4.3 g/dL (ref 3.6–5.1)
ALT: 20 U/L (ref 6–29)
AST: 23 U/L (ref 10–35)
Alkaline phosphatase (APISO): 82 U/L (ref 33–130)
BUN: 13 mg/dL (ref 7–25)
CO2: 28 mmol/L (ref 20–32)
CREATININE: 0.88 mg/dL (ref 0.50–1.05)
Calcium: 9.5 mg/dL (ref 8.6–10.4)
Chloride: 103 mmol/L (ref 98–110)
GLUCOSE: 95 mg/dL (ref 65–99)
Globulin: 3.6 g/dL (calc) (ref 1.9–3.7)
POTASSIUM: 4.2 mmol/L (ref 3.5–5.3)
SODIUM: 139 mmol/L (ref 135–146)
TOTAL PROTEIN: 7.9 g/dL (ref 6.1–8.1)
Total Bilirubin: 0.5 mg/dL (ref 0.2–1.2)

## 2017-02-20 LAB — HEMOGLOBIN A1C
Hgb A1c MFr Bld: 6.8 % of total Hgb — ABNORMAL HIGH (ref <5.7)
Mean Plasma Glucose: 148 (calc)
eAG (mmol/L): 8.2 (calc)

## 2017-02-20 LAB — LIPID PANEL
CHOL/HDL RATIO: 4.3 (calc) (ref <5.0)
Cholesterol: 189 mg/dL (ref <200)
HDL: 44 mg/dL — ABNORMAL LOW (ref >50)
LDL CHOLESTEROL (CALC): 119 mg/dL — AB
Non-HDL Cholesterol (Calc): 145 mg/dL (calc) — ABNORMAL HIGH (ref <130)
Triglycerides: 143 mg/dL (ref <150)

## 2017-02-20 MED ORDER — ROSUVASTATIN CALCIUM 10 MG PO TABS: 10.0000 mg | ORAL_TABLET | Freq: Every day | ORAL | 0 refills | Status: DC

## 2017-05-13 ENCOUNTER — Other Ambulatory Visit: Payer: Self-pay | Admitting: Medical

## 2017-05-13 DIAGNOSIS — J4540 Moderate persistent asthma, uncomplicated: Secondary | ICD-10-CM

## 2017-05-13 DIAGNOSIS — J309 Allergic rhinitis, unspecified: Secondary | ICD-10-CM

## 2017-05-14 NOTE — Telephone Encounter (Signed)
Is this ok to refill?  

## 2017-05-22 ENCOUNTER — Other Ambulatory Visit: Payer: Self-pay | Admitting: Medical

## 2017-05-26 ENCOUNTER — Ambulatory Visit: Payer: BC Managed Care – PPO | Admitting: Medical

## 2017-05-26 VITALS — BP 130/80 | HR 98 | Temp 98.5°F | Wt 187.0 lb

## 2017-05-26 DIAGNOSIS — E785 Hyperlipidemia, unspecified: Secondary | ICD-10-CM | POA: Diagnosis not present

## 2017-05-26 DIAGNOSIS — R52 Pain, unspecified: Secondary | ICD-10-CM | POA: Diagnosis not present

## 2017-05-26 DIAGNOSIS — J029 Acute pharyngitis, unspecified: Secondary | ICD-10-CM | POA: Diagnosis not present

## 2017-05-26 DIAGNOSIS — R0981 Nasal congestion: Secondary | ICD-10-CM

## 2017-05-26 DIAGNOSIS — E118 Type 2 diabetes mellitus with unspecified complications: Secondary | ICD-10-CM

## 2017-05-26 DIAGNOSIS — R748 Abnormal levels of other serum enzymes: Secondary | ICD-10-CM

## 2017-05-26 LAB — POC INFLUENZA A&B (BINAX/QUICKVUE)
INFLUENZA A, POC: NEGATIVE
INFLUENZA B, POC: NEGATIVE

## 2017-05-26 NOTE — Progress Notes (Signed)
Subjective:  Stacey Simon is a 57 y.o. female who presents for med check.  She notes 1 day hx/o head congestion, runny nose, sore throat.  Using salt water gargles.  No body aches, chills, fever, NVD.     Diabetes - not checking glucose, eating relatively healthy, not exercising much.  compliant with medication.  hyperlipemia - compliant with medication.  Labs today  She is not currently exercising  She tries to eat healthy  No other c/o.  The following portions of the patient's history were reviewed and updated as appropriate: allergies, current medications, past family history, past medical history, past social history, past surgical history and problem list.  ROS Otherwise as in subjective above  Past Medical History:  Diagnosis Date  . Allergy   . Anemia    in college  . Asthma    a few prior hospitalizations, last 1992  . Diabetes mellitus without complication ([place]) 10/9483  . Low HDL (under 40) 08/2015   Current Outpatient Medications on File Prior to Visit  Medication Sig Dispense Refill  . albuterol (PROVENTIL HFA;VENTOLIN HFA) 108 (90 Base) MCG/ACT inhaler Inhale 2 puffs into the lungs every 6 (six) hours as needed for wheezing or shortness of breath. 1 Inhaler 1  . Fluticasone-Salmeterol (ADVAIR DISKUS) 250-50 MCG/DOSE AEPB Inhale 1 puff into the lungs 2 (two) times daily. 60 each 5  . rosuvastatin (CRESTOR) 10 MG tablet TAKE 1 TABLET BY MOUTH EVERYDAY AT BEDTIME 90 tablet 0   No current facility-administered medications on file prior to visit.     Objective:Exam BP 130/80   Pulse 98   Temp 98.5 F (36.9 C)   Wt 187 lb (84.8 kg)   SpO2 97%   BMI 29.73 kg/m   General appearance: alert, no distress, WD/WN HENT - mild erythema of pharmacy, otherwise unremarkable HENT and oral exam Neck: supple, no lymphadenopathy, no thyromegaly, no masses Heart: RRR, normal S1, S2, no murmurs Lungs: CTA bilaterally, no wheezes, rhonchi, or rales Abdomen: +bs,  soft, non tender, non distended, no masses, no hepatomegaly, no splenomegaly Back: non tender Pulses: 2+ radial pulses, 1+ pedal pulses, normal cap refill Ext: no edema  Diabetic Foot Exam - Simple   Simple Foot Form Diabetic Foot exam was performed with the following findings:  Yes 05/26/2017  8:35 PM  Visual Inspection See comments:  Yes Sensation Testing Intact to touch and monofilament testing bilaterally:  Yes Pulse Check See comments:  Yes Comments 1+ pedal pulses, bilat great toenails significantly thickened.       Assessment: Encounter Diagnoses  Name Primary?  . Diabetes mellitus with complication ([place]) Yes  . Low serum HDL   . Hyperlipidemia, unspecified hyperlipidemia type   . Sore throat   . Head congestion   . Body aches     Plan: Diabetes -  Labs today.  Advised daily foot checks, yearly eye doctor f/u.  Counseled on diet, exercise  hyperlipidemia - labs today, c/t same medication  Sore throat, headache congestion - negative flu swab.  Discussed supportive care.  Follow up: pending labs  Malai was seen today for med check.  Diagnoses and all orders for this visit:  Diabetes mellitus with complication ([place]) -     Hemoglobin A1c -     Comprehensive metabolic panel -     CBC -     Lipid panel -     TSH -     Microalbumin / creatinine urine ratio -  HM DIABETES EYE EXAM -     HM DIABETES FOOT EXAM  Low serum HDL -     Lipid panel  Hyperlipidemia, unspecified hyperlipidemia type -     Lipid panel  Sore throat  Head congestion  Body aches -     POC Influenza A&B(BINAX/QUICKVUE)

## 2017-05-27 LAB — COMPREHENSIVE METABOLIC PANEL
A/G RATIO: 1.5 (ref 1.2–2.2)
ALT: 17 IU/L (ref 0–32)
AST: 20 IU/L (ref 0–40)
Albumin: 4.8 g/dL (ref 3.5–5.5)
Alkaline Phosphatase: 85 IU/L (ref 39–117)
BUN / CREAT RATIO: 13 (ref 9–23)
BUN: 12 mg/dL (ref 6–24)
Bilirubin Total: 0.5 mg/dL (ref 0.0–1.2)
CALCIUM: 10.3 mg/dL — AB (ref 8.7–10.2)
CO2: 26 mmol/L (ref 20–29)
CREATININE: 0.91 mg/dL (ref 0.57–1.00)
Chloride: 102 mmol/L (ref 96–106)
GFR calc Af Amer: 80 mL/min/{1.73_m2} (ref >59)
GFR, EST NON AFRICAN AMERICAN: 70 mL/min/{1.73_m2} (ref >59)
GLOBULIN, TOTAL: 3.3 g/dL (ref 1.5–4.5)
Glucose: 100 mg/dL — ABNORMAL HIGH (ref 65–99)
POTASSIUM: 4.3 mmol/L (ref 3.5–5.2)
SODIUM: 140 mmol/L (ref 134–144)
Total Protein: 8.1 g/dL (ref 6.0–8.5)

## 2017-05-27 LAB — CBC
Hematocrit: 38.1 % (ref 34.0–46.6)
Hemoglobin: 13.1 g/dL (ref 11.1–15.9)
MCH: 27.2 pg (ref 26.6–33.0)
MCHC: 34.4 g/dL (ref 31.5–35.7)
MCV: 79 fL (ref 79–97)
PLATELETS: 303 10*3/uL (ref 150–379)
RBC: 4.81 x10E6/uL (ref 3.77–5.28)
RDW: 13.8 % (ref 12.3–15.4)
WBC: 6.7 10*3/uL (ref 3.4–10.8)

## 2017-05-27 LAB — LIPID PANEL
CHOLESTEROL TOTAL: 120 mg/dL (ref 100–199)
Chol/HDL Ratio: 2.8 ratio (ref 0.0–4.4)
HDL: 43 mg/dL (ref >39)
LDL Calculated: 47 mg/dL (ref 0–99)
Triglycerides: 149 mg/dL (ref 0–149)
VLDL Cholesterol Cal: 30 mg/dL (ref 5–40)

## 2017-05-27 LAB — TSH: TSH: 1.59 u[IU]/mL (ref 0.450–4.500)

## 2017-05-27 LAB — MICROALBUMIN / CREATININE URINE RATIO
CREATININE, UR: 69.9 mg/dL
MICROALB/CREAT RATIO: 69.7 mg/g{creat} — AB (ref 0.0–30.0)
MICROALBUM., U, RANDOM: 48.7 ug/mL

## 2017-05-27 LAB — HEMOGLOBIN A1C
Est. average glucose Bld gHb Est-mCnc: 148 mg/dL
HEMOGLOBIN A1C: 6.8 % — AB (ref 4.8–5.6)

## 2017-05-28 ENCOUNTER — Other Ambulatory Visit: Payer: Self-pay | Admitting: Medical

## 2017-05-28 MED ORDER — LOSARTAN POTASSIUM 25 MG PO TABS: 25.0000 mg | ORAL_TABLET | Freq: Every day | ORAL | 3 refills | Status: DC

## 2017-05-28 MED ORDER — ALBUTEROL SULFATE HFA 108 (90 BASE) MCG/ACT IN AERS: 2.0000 | INHALATION_SPRAY | Freq: Four times a day (QID) | RESPIRATORY_TRACT | 1 refills | Status: DC | PRN

## 2017-05-28 MED ORDER — FLUTICASONE-SALMETEROL 250-50 MCG/DOSE IN AEPB: 1.0000 | INHALATION_SPRAY | Freq: Two times a day (BID) | RESPIRATORY_TRACT | 5 refills | Status: DC

## 2017-05-30 ENCOUNTER — Telehealth: Payer: Self-pay

## 2017-05-30 NOTE — Telephone Encounter (Signed)
Yes go ahead and take the whole pill, monitor blood pressure if.  Normal is 120/70 possible.  If she is getting low readings such as 105/60 or something similar or feeling dizzy then let me know

## 2017-05-30 NOTE — Telephone Encounter (Signed)
Pt called you wanted her to being taken losartan 25 mg 1/2 tablet. She p/u the medicine however the pill is to small for her to cut in half. Do you want her to take the whole pill ?

## 2017-06-02 NOTE — Telephone Encounter (Signed)
Called and notified pt of this she will check her b/p and will inform us if get to be to low.

## 2017-06-05 ENCOUNTER — Other Ambulatory Visit: Payer: Self-pay | Admitting: Medical

## 2017-06-17 ENCOUNTER — Encounter: Payer: Self-pay | Admitting: Gastroenterology

## 2017-06-25 ENCOUNTER — Ambulatory Visit: Payer: BC Managed Care – PPO | Admitting: Medical

## 2017-06-30 ENCOUNTER — Other Ambulatory Visit: Payer: Self-pay | Admitting: Occupational Medicine

## 2017-06-30 ENCOUNTER — Ambulatory Visit: Payer: Self-pay

## 2017-06-30 DIAGNOSIS — M25562 Pain in left knee: Secondary | ICD-10-CM

## 2017-08-13 ENCOUNTER — Encounter: Payer: Self-pay | Admitting: Medical

## 2017-08-13 ENCOUNTER — Ambulatory Visit: Payer: BC Managed Care – PPO | Admitting: Medical

## 2017-08-13 VITALS — BP 128/80 | HR 67 | Temp 98.3°F | Ht 66.0 in | Wt 184.0 lb

## 2017-08-13 DIAGNOSIS — R05 Cough: Secondary | ICD-10-CM

## 2017-08-13 DIAGNOSIS — R059 Cough, unspecified: Secondary | ICD-10-CM

## 2017-08-13 DIAGNOSIS — J0110 Acute frontal sinusitis, unspecified: Secondary | ICD-10-CM

## 2017-08-13 DIAGNOSIS — H6123 Impacted cerumen, bilateral: Secondary | ICD-10-CM | POA: Diagnosis not present

## 2017-08-13 DIAGNOSIS — J45901 Unspecified asthma with (acute) exacerbation: Secondary | ICD-10-CM | POA: Diagnosis not present

## 2017-08-13 MED ORDER — AMOXICILLIN 875 MG PO TABS: 875.0000 mg | ORAL_TABLET | Freq: Two times a day (BID) | ORAL | 0 refills | Status: DC

## 2017-08-13 NOTE — Patient Instructions (Signed)
Encounter Diagnoses  Name Primary?  . Acute non-recurrent frontal sinusitis Yes  . Cough   . Mild asthma exacerbation   . Impacted cerumen of both ears    Recommendations:  I am treating you for a sinus and respiratory tract infection.  Medications prescribed today: Amoxicillin antibiotic.   Make sure you complete the course of antibiotics.  Specific home care recommendations today include:  You may use over-the-counter Mucinex DM or Robitussin DM for cough and congestion.  Pain/fever relief: You may use over-the-counter Tylenol for pain or fever  Drink extra fluids. Fluids help thin the mucus so your sinuses can drain more easily.   Applying either moist heat or ice packs to the sinus areas may help relieve discomfort.  Use saline nasal sprays to help moisten your sinuses. The sprays can be found at your local drugstore.   Please call or return if you are worse or not improving in the next 5-7 days.       Sinusitis Sinuses are air pockets within the bones of your face. The growth of bacteria within a sinus leads to infection. Infection keeps the sinuses from draining. This infection is called sinusitis. SYMPTOMS There will be different areas of pain depending on which sinuses have become infected.  The maxillary sinuses often produce pain beneath the eyes.   Frontal sinusitis may cause pain in the middle of the forehead and above the eyes.  Other problems (symptoms) include: 1. Toothaches.  2. Colored, pus-like (purulent) drainage from the nose.  3. Any swelling, warmth, or tenderness over the sinus areas may be signs of infection.  TREATMENT Sinusitis is most often determined by an exam and you may have x-rays taken. If x-rays have been taken, make sure you obtain your results. Or find out how you are to obtain them. Your caregiver may give you medications (antibiotics). These are medications that will help kill the infection. You may also be given a medication  (decongestant) that helps to reduce sinus swelling.  HOME CARE INSTRUCTIONS  Only take over-the-counter or prescription medicines for pain, discomfort, or fever as directed by your caregiver.   Drink extra fluids. Fluids help thin the mucus so your sinuses can drain more easily.   Applying either moist heat or ice packs to the sinus areas may help relieve discomfort.   Use saline nasal sprays to help moisten your sinuses. The sprays can be found at your local drugstore.  SEEK IMMEDIATE MEDICAL CARE IF YOU DEVELOP:  High fever that is still present after two days of antibiotic treatment.   Increasing pain, severe headaches, or toothache.   Nausea, vomiting, or drowsiness.   Unusual swelling around the face or trouble seeing.  MAKE SURE YOU:   Understand these instructions.   Will watch your condition.   Will get help right away if you are not doing well or get worse.  Document Released: 04/29/2005 Document Re-Released: 04/11/2008 [facility] Patient Information 2011 [place].    For ear wax, you can use some of the recommendations below:  Cerumen Plug A cerumen plug is having too much wax in your ear canal. The outer ear canal is lined with hairs and glands that secrete wax. This wax is called cerumen. This protects the ear canal. It also helps prevent material from entering the ear. Too much wax can cause a feeling of fullness in the ears, decreased hearing, ringing in the ears, or an earache. Sometimes your caregiver will remove a cerumen plug with an instrument  called a curette. Or he/she may flush the ear canal with warm water from a syringe to remove the wax. You may simply be sent home to follow the home care instructions below for wax removal. Generally ear wax does not have to be removed unless it is causing a problem such as one of those listed above. When too much wax is causing a problem, the following are a few home remedies which can be used to help this  problem. HOME CARE INSTRUCTIONS   Put a couple drops of glycerin, baby oil, or mineral oil in the ear a couple times of day. Do this every day for several days. After putting the drops in, you will need to lay with the affected ear pointing up for a couple minutes. This allows the drops to remain in the canal and run down to the area of wax blockage. This will soften the wax plug. It may also make your hearing worse as the wax softens and blocks the canal even more.  After a couple days, you may gently flush the ear canal with warm water from a syringe. Do this by pulling your ear up and back with your head tilted slightly forward and towards a pan to catch the water. This is most easily done with a helper. You can also accomplish the same thing by letting the shower beat into your ear canal to wash the wax out. Sometimes this will not be immediately successful. You will have to return to the first step of using the oil to further soften the wax. Then resume washing the ear canal out with a syringe or shower.  Following removal of the wax, put ten to twenty drops of rubbing alcohol into the outer ears. This will dry the canal and prevent an infection.  Do not irrigate or wash out your ears if you have had a perforated ear drum or mastoid surgery. SEEK IMMEDIATE MEDICAL CARE IF:   You are unsuccessful with the above instructions for home care.  You develop ear pain or drainage from the ear. MAKE SURE YOU:   Understand these instructions.  Will watch your condition.  Will get help right away if you are not doing well or get worse. Document Released: 01/22/2001 Document Revised: 07/22/2011 Document Reviewed: 04/20/2008 [facility] Patient Information 2014 [place].

## 2017-08-13 NOTE — Progress Notes (Signed)
Subjective:  Stacey Simon is a 57 y.o. female who presents for  Chief Complaint  Patient presents with  . Acute Visit    cough, nasal congestion, chest congestion, runny nose, ears stopped up,   Symptoms include almost a week of head and chest congestion, runny nose, coughing, sinus pressure.  Has had some wheezing, using her inhaler.  Feels sick, congested.  Hurts to cough.   Doesn't think this is just allergies.    Denies fever, NVD, no teeth pain, no sore throat. No body aches, chills.  Using nothing for symptoms. reports sick contacts.  Past history is significant for asthma, allergies, diabetes.    Patient is not a smoker. No other aggravating or relieving factors.  No other complaint.    Past Medical History:  Diagnosis Date  . Allergy   . Anemia    in college  . Asthma    a few prior hospitalizations, last 1992  . Diabetes mellitus without complication ([place]) 09/1759  . Low HDL (under 40) 08/2015    Current Outpatient Medications on File Prior to Visit  Medication Sig Dispense Refill  . albuterol (PROVENTIL HFA;VENTOLIN HFA) 108 (90 Base) MCG/ACT inhaler INHALE 2 PUFFS BY MOUTH INTO THE LUNGS EVERY 6 HOURS AS NEEDED FOR WHEEZE /BREATH SHORTNESS 8.5 g 1  . Fluticasone-Salmeterol (ADVAIR DISKUS) 250-50 MCG/DOSE AEPB Inhale 1 puff into the lungs 2 (two) times daily. 60 each 5  . losartan (COZAAR) 25 MG tablet Take 1 tablet (25 mg total) by mouth daily. 90 tablet 3  . rosuvastatin (CRESTOR) 10 MG tablet TAKE 1 TABLET BY MOUTH EVERYDAY AT BEDTIME 90 tablet 0   No current facility-administered medications on file prior to visit.     ROS as in subjective   Objective: BP 128/80 (BP Location: Right Arm, Patient Position: Sitting, Cuff Size: Normal)   Pulse 67   Temp 98.3 F (36.8 C) (Oral)   Ht 5\' 6"  (1.676 m)   Wt 184 lb (83.5 kg)   SpO2 99%   BMI 29.70 kg/m   General appearance: Alert, well developed, well nourished, no distress      Skin: warm, no rash                           Head: +mild frontal sinus tenderness,                            Eyes: conjunctiva pink, corneas clear                            Ears: moderately impacted cerumen bilat, can't visualize TMs, external ear canals normal                          Nose: septum midline, turbinates swollen, with erythema and clear discharge             Mouth/throat: MMM, tongue normal, mild pharyngeal erythema                           Neck: supple, no adenopathy, no thyromegaly, non tender                         Lungs: clear, no wheezes, no rales,few rhonchi  Assessment  Encounter Diagnoses  Name Primary?  . Acute non-recurrent frontal sinusitis Yes  . Cough   . Mild asthma exacerbation   . Impacted cerumen of both ears       Plan: Discussed diagnosis of sinusitis.   Discussed usual time frame to see improvement. Discussed possible complications or symptoms that would prompt call back or recheck within the next few days.     Medications prescribed:  Complete the course of antibiotic antibiotic prescribed today.   Begin Mucinex DM for cough suppression if OTC cough medication isn't helping.   Caution advised as this may cause drowsiness.  Specific home care recommendations discussed:  Pain/fever relief: You may use over-the-counter tylenol for pain or fever  Drink extra fluids. Fluids help thin the mucus so your sinuses can drain more easily.   Applying either moist heat or ice packs to the sinus areas may help relieve discomfort.  Use saline nasal sprays to help moisten your sinuses. The sprays can be found at your local drugstore.   Gave home remedies to try for impacted cerumen  Patient was advised to call or return if worse or not improving in the next few days.    Patient voiced understanding of diagnosis, recommendations, and treatment plan.

## 2017-08-14 ENCOUNTER — Other Ambulatory Visit: Payer: Self-pay | Admitting: Medical

## 2017-10-11 HISTORY — PX: KNEE ARTHROSCOPY W/ MENISCAL REPAIR: SHX1877

## 2017-10-21 ENCOUNTER — Encounter: Payer: Self-pay | Admitting: Medical

## 2017-10-21 ENCOUNTER — Ambulatory Visit: Payer: BC Managed Care – PPO | Admitting: Medical

## 2017-10-21 VITALS — BP 136/78 | HR 71 | Temp 98.0°F | Ht 66.0 in | Wt 179.6 lb

## 2017-10-21 DIAGNOSIS — J301 Allergic rhinitis due to pollen: Secondary | ICD-10-CM

## 2017-10-21 DIAGNOSIS — J4540 Moderate persistent asthma, uncomplicated: Secondary | ICD-10-CM

## 2017-10-21 MED ORDER — FLUTICASONE FUROATE-VILANTEROL 200-25 MCG/INH IN AEPB: 1.0000 | INHALATION_SPRAY | Freq: Every day | RESPIRATORY_TRACT | 3 refills | Status: DC

## 2017-10-21 MED ORDER — ALBUTEROL SULFATE HFA 108 (90 BASE) MCG/ACT IN AERS: INHALATION_SPRAY | RESPIRATORY_TRACT | 1 refills | Status: DC

## 2017-10-21 NOTE — Progress Notes (Signed)
Subjective: Chief Complaint  Patient presents with  . Wheezing    x 1week    Here for complaint of wheezing and asthma.  She recently was supposed to have knee arthroscopically through orthopedic surgical center but apparently was wheezing when she got there.  She was given a breathing treatment and surgery was canceled so she get the asthma improved.  She has history of asthma, in the past year had been using Advair regularly for prevention, albuterol as needed.  She notes having congestion yesterday, bud didn't feel bad.  However, when she went to surgery center, they heard wheezing and gave her treatment.  She notes by the time she left after the single treatment she felt fine.  She notes in the past 2 months her asthma is varied.  Sometimes uses albuterol none in a week,sometimes a few times per week.  This past week her husband was using oil paint in the house which may have flared her wheezing.  She has been using Advair at least once daily but not always.  No animals in the house.  Nonsmoker.  No other aggravating or relieving factors. No other complaint.   Past Medical History:  Diagnosis Date  . Allergy   . Anemia    in college  . Asthma    a few prior hospitalizations, last 1992  . Diabetes mellitus without complication ([place]) 12/1189  . Low HDL (under 40) 08/2015   Current Outpatient Medications on File Prior to Visit  Medication Sig Dispense Refill  . albuterol (PROVENTIL HFA;VENTOLIN HFA) 108 (90 Base) MCG/ACT inhaler INHALE 2 PUFFS BY MOUTH INTO THE LUNGS EVERY 6 HOURS AS NEEDED FOR WHEEZE /BREATH SHORTNESS 8.5 g 1  . Fluticasone-Salmeterol (ADVAIR DISKUS) 250-50 MCG/DOSE AEPB Inhale 1 puff into the lungs 2 (two) times daily. 60 each 5  . losartan (COZAAR) 25 MG tablet Take 1 tablet (25 mg total) by mouth daily. 90 tablet 3  . rosuvastatin (CRESTOR) 10 MG tablet TAKE 1 TABLET BY MOUTH EVERYDAY AT BEDTIME 90 tablet 0   No current facility-administered medications on file  prior to visit.    ROS as in subjective   Objective: BP 136/78   Pulse 71   Temp 98 F (36.7 C) (Oral)   Ht 5\' 6"  (1.676 m)   Wt 179 lb 9.6 oz (81.5 kg)   SpO2 98%   BMI 28.99 kg/m   General appearance: alert, no distress, WD/WN,  HEENT: normocephalic, sclerae anicteric, TMs pearly, nares patent, no discharge or erythema, pharynx normal Oral cavity: MMM, no lesions Neck: supple, no lymphadenopathy, no thyromegaly, no masses Heart: RRR, normal S1, S2, no murmurs Lungs: CTA bilaterally, no wheezes, rhonchi, or rales Ext: no edema Pulses: 2+ symmetric, upper and lower extremities, normal cap refill    Assessment: Encounter Diagnoses  Name Primary?  . Moderate persistent asthma without complication Yes  . Allergic rhinitis due to pollen, unspecified seasonality      Plan: PFT abnormal.  Asthma: . Avoid asthma triggers such as pollen, smoke, or cold temperatures for example . You asthma is currently considered moderate persistent asthma. . Begin Breo inhaled medication 1 puff once daily for prevention.  This medication is a Animal nutritionist" medication. . You may use "Albuterol" rescue inhaler 1-2 puffs every 4-6 hours as needed for wheezing, shortness of breath, chest tightness or cough spells.   This is a Financial controller" medication. . You may use "Albuterol" rescue nebulized treatment every 4-6 hours as needed for wheezing, shortness of breath, chest  tightness or cough spells.   This is a Financial controller" medication. . Consider beginning Zyrtec OTC 10mg  tablet daily at bedtime to help reduce allergy symptoms which can trigger asthma symptoms.  . Use this regimen above daily during the next month, spring and fall.   If you continue to have breathing issues, you may need to be on this throughout the year.   Some people need to continue this regimen in the spring and fall, some people may only need to use this regimen during times of respiratory illness such as colds and flu, but some people with  severe asthma need to continue this regimen year round. . Call if you have questions, or call or return if you are not improving on this regimen.  . Plan to recheck in 1 week.  Stacey Simon was seen today for wheezing.  Diagnoses and all orders for this visit:  Moderate persistent asthma without complication  Allergic rhinitis due to pollen, unspecified seasonality  f/u 64mo

## 2017-10-21 NOTE — Patient Instructions (Addendum)
Asthma: . Avoid asthma triggers such as pollen, smoke, or cold temperatures for example . You asthma is currently considered moderate persistent asthma. . Begin Breo inhaled medication 1 puff once daily for prevention.  This medication is a Animal nutritionist" medication. . You may use "Albuterol" rescue inhaler 1-2 puffs every 4-6 hours as needed for wheezing, shortness of breath, chest tightness or cough spells.   This is a Financial controller" medication. . You may use "Albuterol" rescue nebulized treatment every 4-6 hours as needed for wheezing, shortness of breath, chest tightness or cough spells.   This is a Financial controller" medication. . Consider beginning Zyrtec OTC 10mg  tablet daily at bedtime to help reduce allergy symptoms which can trigger asthma symptoms.  . Use this regimen above daily during the next month, spring and fall.   If you continue to have breathing issues, you may need to be on this throughout the year.   Some people need to continue this regimen in the spring and fall, some people may only need to use this regimen during times of respiratory illness such as colds and flu, but some people with severe asthma need to continue this regimen year round. . Call if you have questions, or call or return if you are not improving on this regimen.  . Plan to recheck in 1 week.   ASTHMA MEDICATIONS Two types of medications are used in asthma treatment: quick relief (or rescue) medications and controller medications. All patients with persistent asthma should have a short-acting relief medication on hand for treatment of exacerbations and a controller medication for long-term control.    Rescue medications include Albuterol, Proventil, Ventolin, ProAir, Xopenex.  Rescue medications, also called quick-relief or fast-acting medications, work immediately to relieve asthma symptoms when they occur. They're often inhaled directly into the lungs, where they open up the airways and relieve symptoms such as wheezing,  coughing, and shortness of breath, often within minutes. But as effective as they are, rescue medications don't have a long-term effect.  Controller medications include inhaled corticosteroids (ICSs), long-acting beta agonists (LABAs), leukotriene modifiers, anti-IgE medications, and combination products.  Examples include Qvar, Asmanex, Pulmicort, Advair, Symbicort, and Dulera.  Controller medications, also called preventive or maintenance medications, work over a period of time to reduce airway inflammation and help prevent asthma symptoms from occurring. They may be inhaled or swallowed as a pill or liquid.    PEAK FLOW METER I gave you a peak flow meter prescription today.   On a day when your symptoms are completley at baseline/normal, blow into the peak flow meter forceably for for 5 seconds.  Do this 3 times.   Take the average number, and this is you "personal best."  Write this number on the Asthma Action Plan at the top.    In the green, yellow, and red zones, calculate 80%, 50-80%, and 50%, respectively for the 3 columns to fine your zones.    For example, you get 300, 400, 500 for each of 3 tries.  Your personal best is 400.   Thus, in the green zone, 80% of personal best would be a peak flow reading of 320 or greater.   50-80% in the yellow zone, would be 200-320.   Red zone would be less than 200.   Thus, on a day when symptoms are present, check your peak flow reading, and look at the appropriate zone to determine the course of action.     Peak Flow Meter For people with asthma or certain other  breathing problems, a peak flow meter is a simple but important tool to help with daily asthma management. A peak flow meter is an easy-to-use device that measures how well your lungs are working. Peak flow meters are available over-the-counter. The readings from the meter will help you and your caregiver:   Determine the severity of your asthma.  Evaluate the effectiveness of your  current treatment.  Determine when to add or stop certain medicines.  Recognize an asthma attack before signs or symptoms appear.  Decide when to seek emergency care. Your "personal best" Your "personal best" is the highest peak flow rate you can reach over a 2-3 week period when you feel good and have no asthma symptoms. Your flow rate serves as a benchmark in your daily self-management plan. Because everyone's asthma is different, your personal best will be unique to you.  Your caregiver will help you figure out your personal best. Typically, you will take readings once or twice a day for 2 weeks when you are not having symptoms. The highest consistent reading during the trial period is your "personal best."  INSTRUCTIONS FOR USE  1. Move the upper marker to the number that is your "personal best." 2. Move the lower marker to the bottom of the numbered scale. 3. Connect the mouthpiece to the peak flow meter. 4. Stand up. 5. Take a deep breath. Fill your lungs completely. 6. Place your lips tightly around the mouthpiece. Blow as hard and as fast as you can with a single breath. 7. Note the final position of the lower marker. This is your peak flow rate. 8. Move the lower marker back to the bottom of the numbered scale. 9. Repeat these steps 2 more times. Record the highest reading of the 3 tries in your asthma diary. For the most accurate readings, it is important to keep your peak flow meter clean. Follow the manufacturer's instructions on how to take care of your peak flow meter.  RESULTS You and your caregiver can use color coded "zones" on the meter to see how your peak flow rate compares to your "personal best." The color code for each zone reflects progressively more severe symptoms:  Green zone = stable   Your peak flow rates are 80 to 100 percent of your personal best. This means that your asthma is under control.  You probably have no asthma signs or symptoms.  Take your  preventive medications as usual. Yellow zone = caution   Your peak flow rates are 50 to 80 percent of your personal best. This means that your asthma is getting worse and could be improved.  You may have signs and symptoms such as coughing, wheezing or chest tightness. However, your peak flow rates may decrease before symptoms appear.  You may need to increase or change your asthma medication. Red zone = danger   Your peak flow rates are less than 50 percent of your personal best. This means that you may be in danger of a medical emergency.  You may have severe coughing, wheezing and shortness of breath. Stop whatever you are doing. Use your rescue inhaler (for example, albuterol) or other medicines to open your airways.  Your asthma action plan will help you decide whether to call your caregiver or seek emergency care. If your flow readings fall too far below your personal best into the yellow or red zone you'll need to take action to prevent or minimize an asthma attack.  RISKS AND COMPLICATIONS  Breathing  too quickly may cause dizziness. At an extreme, this could cause you to pass out. Take your time so you do not get dizzy or light-headed.  If your lips are not placed tightly around the peak flow meter mouthpiece, you will get incorrect low readings. AFTER USE  Rest and breathe slowly and easily.  Keep a record of your progress. Your caregiver can provide you with a simple table to help with this. WHEN TO USE Your caregiver will give you instructions on when to do regular monitoring. You may also need to check your peak flow when:   Asthma symptoms wake you up at night.  You have increased symptoms during the day.  You have a cold, flu or other illness that affects your breathing.  You need quick relief "rescue medication." (If possible, check your peak flow before you use rescue medication. Check it again 20 or 30 minutes after taking medication.) Document Released:  02/24/2007 Document Revised: 07/22/2011 Document Reviewed: 07/23/2007 [facility] Patient Information 2013 [place].     Asthma, Adult Asthma is a disease of the lungs and can make it hard to breathe. Asthma cannot be cured, but medicine can help control it. Asthma may be started (triggered) by:  Pollen.  Dust.  Animal skin flakes (dander).  Molds.  Foods.  Respiratory infections (colds, flu).  Smoke.  Exercise.  Stress.  Other things that cause allergic reactions or allergies (allergens). HOME CARE   Talk to your doctor about how to manage your attacks at home. This may include:  Using a tool called a peak flow meter.  Having medicine ready to stop the attack.  Take all medicine as told by your doctor.  Wash bed sheets and blankets every week in hot water and put them in the dryer.  Drink enough fluids to keep your pee (urine) clear or pale yellow.  Always be ready to get emergency help. Write down the phone number for your doctor. Keep it where you can easily find it.  Talk about exercise routines with your doctor.  If animal dander is causing your asthma, you may need to find a new home for your pet(s). GET HELP RIGHT AWAY IF:   You have muscle aches.  You cough more.  You have chest pain.  You have thick spit (sputum) that changes to yellow, green, gray, or bloody.  Medicine does not stop your wheezing.  You have problems breathing.  You have a fever.  Your medicine causes:  A rash.  Itching.  Puffiness (swelling).  Breathing problems. MAKE SURE YOU:   Understand these instructions.  Will watch your condition.  Will get help right away if you are not doing well or get worse. Document Released: 10/16/2007 Document Revised: 07/22/2011 Document Reviewed: 03/09/2008 [facility] Patient Information 2014 [place], [place].     Asthma Prevention Cigarette smoke, house dust, molds, pollens, animal dander, certain insects, exercise, and  even cold air are all triggers that can cause an asthma attack. Often, no specific triggers are identified.  Take the following measures around your house to reduce attacks:  Avoid cigarette and other smoke. No smoking should be allowed in a home where someone with asthma lives. If smoking is allowed indoors, it should be done in a room with a closed door, and a window should be opened to clear the air. If possible, do not use a wood-burning stove, kerosene heater, or fireplace. Minimize exposure to all sources of smoke, including incense, candles, fires, and fireworks.  Decrease pollen  exposure. Keep your windows shut and use central air during the pollen allergy season. Stay indoors with windows closed from late morning to afternoon, if you can. Avoid mowing the lawn if you have grass pollen allergy. Change your clothes and shower after being outside during this time of year.  Remove molds from bathrooms and wet areas. Do this by cleaning the floors with a fungicide or diluted bleach. Avoid using humidifiers, vaporizers, or swamp coolers. These can spread molds through the air. Fix leaky faucets, pipes, or other sources of water that have mold around them.  Decrease house dust exposure. Do this by using bare floors, vacuuming frequently, and changing furnace and air cooler filters frequently. Avoid using feather, wool, or foam bedding. Use polyester pillows and plastic covers over your mattress. Wash bedding weekly in hot water (hotter than 130 F).  Try to get someone else to vacuum for you once or twice a week, if you can. Stay out of rooms while they are being vacuumed and for a short while afterward. If you vacuum, use a dust mask (from a hardware store), a double-layered or microfilter vacuum cleaner bag, or a vacuum cleaner with a HEPA filter.  Avoid perfumes, talcum powder, hair spray, paints and other strong odors and fumes.  Keep warm-blooded pets (cats, dogs, rodents, birds) outside the  home if they are triggers for asthma. If you can't keep the pet outdoors, keep the pet out of your bedroom and other sleeping areas at all times, and keep the door closed. Remove carpets and furniture covered with cloth from your home. If that is not possible, keep the pet away from fabric-covered furniture and carpets.  Eliminate cockroaches. Keep food and garbage in closed containers. Never leave food out. Use poison baits, traps, powders, gels, or paste (for example, boric acid). If a spray is used to kill cockroaches, stay out of the room until the odor goes away.  Decrease indoor humidity to less than 60%. Use an indoor air cleaning device.  Avoid sulfites in foods and beverages. Do not drink beer or wine or eat dried fruit, processed potatoes, or shrimp if they cause asthma symptoms.  Avoid cold air. Cover your nose and mouth with a scarf on cold or windy days.  Avoid aspirin. This is the most common drug causing serious asthma attacks.  If exercise triggers your asthma, ask your caregiver how you should prepare before exercising. (For example, ask if you could use your inhaler 10 minutes before exercising.)  Avoid close contact with people who have a cold or the flu since your asthma symptoms may get worse if you catch the infection from them. Wash your hands thoroughly after touching items that may have been handled by others with a respiratory infection.  Get a flu shot every year to protect against the flu virus, which often makes asthma worse for days to weeks. Also get a pneumonia shot once every five to 10 years. Call your caregiver if you want further information about measures you can take to help prevent asthma attacks. Document Released: 04/29/2005 Document Revised: 07/22/2011 Document Reviewed: 03/07/2009 [facility] Patient Information 2014 [place], [place].

## 2017-10-27 ENCOUNTER — Ambulatory Visit: Payer: BC Managed Care – PPO | Admitting: Medical

## 2017-10-27 ENCOUNTER — Telehealth: Payer: Self-pay

## 2017-10-27 VITALS — BP 120/74 | HR 60 | Resp 16 | Ht 66.0 in | Wt 182.0 lb

## 2017-10-27 DIAGNOSIS — E785 Hyperlipidemia, unspecified: Secondary | ICD-10-CM

## 2017-10-27 DIAGNOSIS — E118 Type 2 diabetes mellitus with unspecified complications: Secondary | ICD-10-CM

## 2017-10-27 DIAGNOSIS — R809 Proteinuria, unspecified: Secondary | ICD-10-CM | POA: Diagnosis not present

## 2017-10-27 DIAGNOSIS — J4540 Moderate persistent asthma, uncomplicated: Secondary | ICD-10-CM

## 2017-10-27 MED ORDER — METFORMIN HCL 500 MG PO TABS: 500.0000 mg | ORAL_TABLET | Freq: Every day | ORAL | 2 refills | Status: DC

## 2017-10-27 MED ORDER — FLUTICASONE FUROATE-VILANTEROL 200-25 MCG/INH IN AEPB: 1.0000 | INHALATION_SPRAY | Freq: Every day | RESPIRATORY_TRACT | 3 refills | Status: DC

## 2017-10-27 MED ORDER — LOSARTAN POTASSIUM 25 MG PO TABS: 25.0000 mg | ORAL_TABLET | Freq: Every day | ORAL | 1 refills | Status: DC

## 2017-10-27 MED ORDER — ROSUVASTATIN CALCIUM 10 MG PO TABS: ORAL_TABLET | ORAL | 1 refills | Status: DC

## 2017-10-27 NOTE — Telephone Encounter (Signed)
I left message on voicemail for patient that Audelia Acton had resent Metformin into her pharmacy.

## 2017-10-27 NOTE — Progress Notes (Signed)
Subjective: Chief Complaint  Patient presents with  . Follow-up    1 week follow up    Here for recheck on asthma.  Last visit we changed to breo for better efficacy.  She is using breo, not having to use albuterol rescue.  Feels fine, no SOB, no wheezing, no cough, no allergy symptoms.   No animals in the home, nonsmoker, no second hand smoke.    Not checking sugars.   No recent polyuria, polydipsia  No other aggravating or relieving factors. No other complaint.   Past Medical History:  Diagnosis Date  . Allergy   . Anemia    in college  . Asthma    a few prior hospitalizations, last 1992  . Diabetes mellitus without complication ([place]) 12/8414  . Low HDL (under 40) 08/2015   Current Outpatient Medications on File Prior to Visit  Medication Sig Dispense Refill  . albuterol (PROVENTIL HFA;VENTOLIN HFA) 108 (90 Base) MCG/ACT inhaler INHALE 2 PUFFS BY MOUTH INTO THE LUNGS EVERY 6 HOURS AS NEEDED FOR WHEEZE /BREATH SHORTNESS 18 g 1   No current facility-administered medications on file prior to visit.    ROS as in subjective   Objective: BP 120/74   Pulse 60   Resp 16   Ht 5\' 6"  (1.676 m)   Wt 182 lb (82.6 kg)   SpO2 97%   BMI 29.38 kg/m   BP Readings from Last 3 Encounters:  10/27/17 120/74  10/21/17 136/78  08/13/17 128/80   Wt Readings from Last 3 Encounters:  10/27/17 182 lb (82.6 kg)  10/21/17 179 lb 9.6 oz (81.5 kg)  08/13/17 184 lb (83.5 kg)   General appearance: alert, no distress, WD/WN,  HEENT: normocephalic, sclerae anicteric, TMs pearly, nares patent, no discharge or erythema, pharynx normal Oral cavity: MMM, no lesions Neck: supple, no lymphadenopathy, no thyromegaly, no masses Heart: RRR, normal S1, S2, no murmurs Lungs: CTA bilaterally, no wheezes, rhonchi, or rales Ext: no edema Pulses: 2+ symmetric, upper and lower extremities, normal cap refill    Assessment: Encounter Diagnoses  Name Primary?  . Moderate persistent asthma, unspecified  whether complicated Yes  . Diabetes mellitus with complication ([place])   . Hyperlipidemia, unspecified hyperlipidemia type   . Microalbuminuria      Plan: PFT abnormal still  Patient Instructions  Diabetes  Check your glucose fasting in the mornings. The goal is for the blood sugars to stay under 130 fasting. Begin Metformin once daily to help control sugar and reduce risk of complication of diabetes Exercise regularly.   Eat a low sugar low fat diet  Microalbuminuria  Your labs this year in 05/2017 shows elevated protein.  This may be due to elevated glucose.  This is a sign of kidney damage We will continue to monitor this, but I recommend you take a medication once daily called Losartan to reduce damage to kidney The Losartan is used to protect the kidney  Asthma For the next 2 weeks, begin Zyrtec or Allegra at night time to help reduce asthma flare.  Take this for at least the next 2 weeks Until your surgery has taken place, use Albuterol rescue inhaler 1-2 puffs three times daily Lets try higher dose Advair 50/500mg , 2 puffs twice daily until you have completed surgery Once surgery has taken place, you can either use Advair, or you can use Breo 1 puff twice daily for prevention  Follow up in 3 months        Stacey Simon was seen today  for follow-up.  Diagnoses and all orders for this visit:  Moderate persistent asthma, unspecified whether complicated -     Spirometry with graph  Diabetes mellitus with complication ([place])  Hyperlipidemia, unspecified hyperlipidemia type  Microalbuminuria  Other orders -     fluticasone furoate-vilanterol (BREO ELLIPTA) 200-25 MCG/INH AEPB; Inhale 1 puff into the lungs daily. -     rosuvastatin (CRESTOR) 10 MG tablet; TAKE 1 TABLET BY MOUTH EVERYDAY AT BEDTIME -     losartan (COZAAR) 25 MG tablet; Take 1 tablet (25 mg total) by mouth daily.    Stacey Simon was seen today for follow-up.  Diagnoses and all orders for this visit:  Moderate  persistent asthma, unspecified whether complicated -     Spirometry with graph  Diabetes mellitus with complication ([place])  Hyperlipidemia, unspecified hyperlipidemia type  Microalbuminuria  Other orders -     fluticasone furoate-vilanterol (BREO ELLIPTA) 200-25 MCG/INH AEPB; Inhale 1 puff into the lungs daily. -     rosuvastatin (CRESTOR) 10 MG tablet; TAKE 1 TABLET BY MOUTH EVERYDAY AT BEDTIME -     losartan (COZAAR) 25 MG tablet; Take 1 tablet (25 mg total) by mouth daily.  f/u 26mo

## 2017-10-27 NOTE — Telephone Encounter (Signed)
Patient went to pharmacy and metformin was not sent in for her.  Need to verify dosage.

## 2017-10-27 NOTE — Patient Instructions (Signed)
Diabetes  Check your glucose fasting in the mornings. The goal is for the blood sugars to stay under 130 fasting. Begin Metformin once daily to help control sugar and reduce risk of complication of diabetes Exercise regularly.   Eat a low sugar low fat diet  Microalbuminuria  Your labs this year in 05/2017 shows elevated protein.  This may be due to elevated glucose.  This is a sign of kidney damage We will continue to monitor this, but I recommend you take a medication once daily called Losartan to reduce damage to kidney The Losartan is used to protect the kidney  Asthma For the next 2 weeks, begin Zyrtec or Allegra at night time to help reduce asthma flare.  Take this for at least the next 2 weeks Until your surgery has taken place, use Albuterol rescue inhaler 1-2 puffs three times daily Lets try higher dose Advair 50/500mg , 2 puffs twice daily until you have completed surgery Once surgery has taken place, you can either use Advair, or you can use Breo 1 puff twice daily for prevention  Follow up in 3 months

## 2017-10-27 NOTE — Addendum Note (Signed)
Addended by: Carlena Hurl on: 10/27/2017 02:31 PM   Modules accepted: Orders

## 2017-10-27 NOTE — Telephone Encounter (Signed)
I resent

## 2017-10-28 ENCOUNTER — Ambulatory Visit: Payer: BC Managed Care – PPO | Admitting: Medical

## 2017-11-05 LAB — HM DIABETES EYE EXAM

## 2017-12-02 ENCOUNTER — Other Ambulatory Visit: Payer: Self-pay | Admitting: Medical

## 2017-12-02 ENCOUNTER — Telehealth: Payer: Self-pay

## 2017-12-02 MED ORDER — ALBUTEROL SULFATE HFA 108 (90 BASE) MCG/ACT IN AERS: INHALATION_SPRAY | RESPIRATORY_TRACT | 1 refills | Status: DC

## 2017-12-02 MED ORDER — FLUTICASONE-SALMETEROL 500-50 MCG/DOSE IN AEPB: 1.0000 | INHALATION_SPRAY | Freq: Two times a day (BID) | RESPIRATORY_TRACT | 11 refills | Status: DC

## 2017-12-02 NOTE — Telephone Encounter (Signed)
Patient has stated she was given a sample of Advair and she now needs a prescription written for Advair 500 to be sent to her pharmacy. She stated she is out of Advair and was told it would be called in after 14 day sample was out. Please advise patient.

## 2017-12-02 NOTE — Telephone Encounter (Signed)
Pt is requesting a new rx for Wilexa 500. Is this request appropriate?

## 2017-12-02 NOTE — Telephone Encounter (Signed)
Do you mean Metformin?

## 2018-01-26 ENCOUNTER — Other Ambulatory Visit: Payer: Self-pay | Admitting: Medical

## 2018-03-27 ENCOUNTER — Encounter: Payer: Self-pay | Admitting: Family Medicine

## 2018-03-27 ENCOUNTER — Ambulatory Visit: Payer: BC Managed Care – PPO | Admitting: Family Medicine

## 2018-03-27 VITALS — BP 132/82 | HR 89 | Temp 98.2°F | Resp 20 | Wt 188.0 lb

## 2018-03-27 DIAGNOSIS — J4540 Moderate persistent asthma, uncomplicated: Secondary | ICD-10-CM

## 2018-03-27 DIAGNOSIS — Z23 Encounter for immunization: Secondary | ICD-10-CM

## 2018-03-27 MED ORDER — ADVAIR DISKUS 500-50 MCG/DOSE IN AEPB: 1.0000 | INHALATION_SPRAY | Freq: Two times a day (BID) | RESPIRATORY_TRACT | 11 refills | Status: DC

## 2018-03-27 NOTE — Progress Notes (Signed)
   Subjective:    Patient ID: Stacey Simon, female    DOB: 01-Dec-1960, 57 y.o.   MRN: [id number]  HPI She complains of difficulty with increasing cough over the last several days.  She does use Advair and occasionally uses albuterol.  She is now using a generic Advair product and states that she went from having to use the albuterol inhaler twice per month and now twice per week.  Presently she is having no fever, chills, sore throat or earache.   Review of Systems     Objective:   Physical Exam Alert and in no distress. Tympanic membranes and canals are normal. Pharyngeal area is normal. Neck is supple without adenopathy or thyromegaly. Cardiac exam shows a regular sinus rhythm without murmurs or gallops. Lungs are clear to auscultation.        Assessment & Plan:  Moderate persistent asthma, unspecified whether complicated - Plan: ADVAIR DISKUS 500-50 MCG/DOSE AEPB  Need for influenza vaccination - Plan: Flu Vaccine QUAD 6+ mos PF IM (Fluarix Quad PF) I explained that this generic is not working as well as it should.  I will give her dispense as written.  Explained that it might cost her more money.  She will keep me informed on this.  Encouraged her to use the inhaler and if she has further difficulty, let me know

## 2018-04-24 ENCOUNTER — Other Ambulatory Visit: Payer: Self-pay | Admitting: Medical

## 2018-05-12 ENCOUNTER — Other Ambulatory Visit: Payer: Self-pay | Admitting: Medical

## 2018-05-14 NOTE — Telephone Encounter (Signed)
Is this ok to refill?  

## 2018-05-14 NOTE — Telephone Encounter (Signed)
Refill but get her in for fasting physical.

## 2018-06-17 ENCOUNTER — Encounter: Payer: Self-pay | Admitting: Medical

## 2018-06-17 ENCOUNTER — Ambulatory Visit: Payer: BC Managed Care – PPO | Admitting: Medical

## 2018-06-17 VITALS — BP 140/80 | HR 95 | Temp 98.2°F | Resp 16 | Ht 66.0 in | Wt 180.8 lb

## 2018-06-17 DIAGNOSIS — R05 Cough: Secondary | ICD-10-CM

## 2018-06-17 DIAGNOSIS — J988 Other specified respiratory disorders: Secondary | ICD-10-CM

## 2018-06-17 DIAGNOSIS — H6690 Otitis media, unspecified, unspecified ear: Secondary | ICD-10-CM | POA: Diagnosis not present

## 2018-06-17 DIAGNOSIS — R059 Cough, unspecified: Secondary | ICD-10-CM

## 2018-06-17 LAB — POCT INFLUENZA A/B
INFLUENZA A, POC: NEGATIVE
INFLUENZA B, POC: NEGATIVE

## 2018-06-17 MED ORDER — AMOXICILLIN 875 MG PO TABS: 875.0000 mg | ORAL_TABLET | Freq: Two times a day (BID) | ORAL | 0 refills | Status: DC

## 2018-06-17 MED ORDER — HYDROCODONE-HOMATROPINE 5-1.5 MG/5ML PO SYRP: 5.0000 mL | ORAL_SOLUTION | Freq: Three times a day (TID) | ORAL | 0 refills | Status: AC | PRN

## 2018-06-17 NOTE — Progress Notes (Addendum)
Subjective: Chief Complaint  Patient presents with  . flu     cough, congestion, achey, runny nose X 2 days   Here for 2 days of illness, cough, congestion, runny nose, body aches, hurts in chest to cough, right ear hurts and is ringing which is new.  No sore throat.  Questionable + fever, chills.  Feels some SOB.   No NVD.   She has no known sick contacts.    Took some Delsym for cough, tylenol for pain.  No other aggravating or relieving factors. No other complaint.  Past Medical History:  Diagnosis Date  . Allergy   . Anemia    in college  . Asthma    a few prior hospitalizations, last 1992  . Diabetes mellitus without complication ([place]) 12/1015  . Low HDL (under 40) 08/2015   Current Outpatient Medications on File Prior to Visit  Medication Sig Dispense Refill  . ADVAIR DISKUS 500-50 MCG/DOSE AEPB Inhale 1 puff into the lungs 2 (two) times daily. 60 each 11  . albuterol (PROVENTIL HFA;VENTOLIN HFA) 108 (90 Base) MCG/ACT inhaler INHALE 2 PUFFS BY MOUTH INTO THE LUNGS EVERY 6 HOURS AS NEEDED FOR WHEEZE /BREATH SHORTNESS 18 Inhaler 0  . losartan (COZAAR) 25 MG tablet Take 1 tablet (25 mg total) by mouth daily. (Patient not taking: Reported on 06/17/2018) 90 tablet 1  . metFORMIN (GLUCOPHAGE) 500 MG tablet TAKE 1 TABLET BY MOUTH EVERY DAY WITH BREAKFAST (Patient not taking: Reported on 06/17/2018) 90 tablet 0  . rosuvastatin (CRESTOR) 10 MG tablet TAKE 1 TABLET BY MOUTH EVERYDAY AT BEDTIME (Patient not taking: Reported on 06/17/2018) 90 tablet 1   No current facility-administered medications on file prior to visit.    ROS as in subjective    Objective: BP 140/80   Pulse 95   Temp 98.2 F (36.8 C) (Oral)   Resp 16   Ht 5\' 6"  (1.676 m)   Wt 180 lb 12.8 oz (82 kg)   SpO2 97%   BMI 29.18 kg/m   Wt Readings from Last 3 Encounters:  06/17/18 180 lb 12.8 oz (82 kg)  03/27/18 188 lb (85.3 kg)  10/27/17 182 lb (82.6 kg)   General appearance: alert, no distress, WD/WN, somewhat ill  appearing HEENT: normocephalic, sclerae anicteric,  TMs pearly, nares patent, no discharge or erythema, pharynx normal Oral cavity: MMM, no lesions Neck: supple, shoddy tender anterior nodes, no thyromegaly, no masses Heart: RRR, normal S1, S2, no murmurs Lungs: CTA bilaterally, no wheezes, rhonchi, or rales    Assessment: Encounter Diagnoses  Name Primary?  . Acute otitis media, unspecified otitis media type Yes  . Cough   . Respiratory tract infection     Plan: Discussed symptoms, findings, and recommendations below  Recommendations:  Begin Amoxicillin antibiotic for ear infection  Rest  hydrate well with water throughout the day  Continue Delsym  You can use Tylenol for fever, aches, etc.  Follow up soon for diabetes med check ,fasting  If worse or not improving in the next 4-5 days, then recheck   Stacey Simon was seen today for flu.  Diagnoses and all orders for this visit:  Acute otitis media, unspecified otitis media type  Cough -     Influenza A/B  Respiratory tract infection  Other orders -     amoxicillin (AMOXIL) 875 MG tablet; Take 1 tablet (875 mg total) by mouth 2 (two) times daily. -     HYDROcodone-homatropine (HYCODAN) 5-1.5 MG/5ML syrup; Take 5 mLs by  mouth every 8 (eight) hours as needed for up to 5 days for cough.

## 2018-06-17 NOTE — Addendum Note (Signed)
Addended by: Carlena Hurl on: 06/17/2018 01:50 PM   Modules accepted: Orders

## 2018-06-17 NOTE — Patient Instructions (Signed)
Recommendations:  Begin Amoxicillin antibiotic for ear infection  Rest  hydrate well with water throughout the day  Continue Delsym  You can use Tylenol for fever, aches, etc.  Follow up soon for diabetes med check ,fasting       Otitis Media, Adult  Otitis media means that the middle ear is red and swollen (inflamed) and full of fluid. The condition usually goes away on its own. Follow these instructions at home:  Take over-the-counter and prescription medicines only as told by your doctor.  If you were prescribed an antibiotic medicine, take it as told by your doctor. Do not stop taking the antibiotic even if you start to feel better.  Keep all follow-up visits as told by your doctor. This is important. Contact a doctor if:  You have bleeding from your nose.  There is a lump on your neck.  You are not getting better in 5 days.  You feel worse instead of better. Get help right away if:  You have pain that is not helped with medicine.  You have swelling, redness, or pain around your ear.  You get a stiff neck.  You cannot move part of your face (paralyzed).  You notice that the bone behind your ear hurts when you touch it.  You get a very bad headache. Summary  Otitis media means that the middle ear is red, swollen, and full of fluid.  This condition usually goes away on its own. In some cases, treatment may be needed.  If you were prescribed an antibiotic medicine, take it as told by your doctor. This information is not intended to replace advice given to you by your health care provider. Make sure you discuss any questions you have with your health care provider. Document Released: 10/16/2007 Document Revised: 05/20/2016 Document Reviewed: 05/20/2016 Elsevier Interactive Patient Education  2019 [employer].

## 2018-07-10 ENCOUNTER — Other Ambulatory Visit: Payer: Self-pay | Admitting: Medical

## 2018-07-10 NOTE — Telephone Encounter (Signed)
Is this ok to refill?  

## 2018-07-23 ENCOUNTER — Telehealth: Payer: Self-pay | Admitting: Medical

## 2018-07-23 NOTE — Telephone Encounter (Signed)
Please make referral back to GI.   She has seen [facility] for prior colonoscopy.  See letter on Cyndi's desk

## 2018-07-27 NOTE — Telephone Encounter (Signed)
Do you want to still refer patient since she has not been in lately?

## 2018-07-27 NOTE — Telephone Encounter (Signed)
I would call the office of GI (in Epic) to see if they need an updated referral for insurance purposes?  If not, then ask them to call her to schedule appt

## 2018-07-28 NOTE — Telephone Encounter (Signed)
Wait until you see patient for complete PE is August 13, 2018.

## 2018-07-28 NOTE — Telephone Encounter (Signed)
She needs an order sent in to [facility] after she is seen by her PCP.  Patient has an appointment in April 2020 for her CPE.   They suggested we wait until then.

## 2018-07-28 NOTE — Telephone Encounter (Signed)
So what specifically do I need to put in or order?

## 2018-08-12 ENCOUNTER — Other Ambulatory Visit: Payer: Self-pay | Admitting: Medical

## 2018-08-12 DIAGNOSIS — J309 Allergic rhinitis, unspecified: Secondary | ICD-10-CM

## 2018-08-12 DIAGNOSIS — J4540 Moderate persistent asthma, uncomplicated: Secondary | ICD-10-CM

## 2018-08-13 ENCOUNTER — Encounter: Payer: BC Managed Care – PPO | Admitting: Medical

## 2018-08-13 NOTE — Telephone Encounter (Signed)
Is this ok to refill?  

## 2018-08-30 ENCOUNTER — Other Ambulatory Visit: Payer: Self-pay | Admitting: Medical

## 2018-08-31 NOTE — Telephone Encounter (Signed)
Is this ok to refill?  

## 2018-09-16 ENCOUNTER — Other Ambulatory Visit (HOSPITAL_COMMUNITY)
Admission: RE | Admit: 2018-09-16 | Discharge: 2018-09-16 | Disposition: A | Discharge status: Home / Self Care | Payer: BC Managed Care – PPO | Source: Ambulatory Visit | Attending: Medical | Admitting: Medical

## 2018-09-16 ENCOUNTER — Ambulatory Visit (INDEPENDENT_AMBULATORY_CARE_PROVIDER_SITE_OTHER): Payer: BC Managed Care – PPO | Admitting: Medical

## 2018-09-16 ENCOUNTER — Other Ambulatory Visit: Payer: Self-pay

## 2018-09-16 ENCOUNTER — Encounter: Payer: Self-pay | Admitting: Medical

## 2018-09-16 VITALS — BP 130/80 | HR 72 | Temp 96.8°F | Resp 16 | Ht 66.0 in | Wt 188.4 lb

## 2018-09-16 DIAGNOSIS — Z860100 Personal history of colon polyps, unspecified: Secondary | ICD-10-CM

## 2018-09-16 DIAGNOSIS — Z124 Encounter for screening for malignant neoplasm of cervix: Secondary | ICD-10-CM | POA: Insufficient documentation

## 2018-09-16 DIAGNOSIS — Z1159 Encounter for screening for other viral diseases: Secondary | ICD-10-CM

## 2018-09-16 DIAGNOSIS — J4540 Moderate persistent asthma, uncomplicated: Secondary | ICD-10-CM

## 2018-09-16 DIAGNOSIS — J301 Allergic rhinitis due to pollen: Secondary | ICD-10-CM | POA: Diagnosis not present

## 2018-09-16 DIAGNOSIS — R809 Proteinuria, unspecified: Secondary | ICD-10-CM

## 2018-09-16 DIAGNOSIS — E118 Type 2 diabetes mellitus with unspecified complications: Secondary | ICD-10-CM

## 2018-09-16 DIAGNOSIS — E785 Hyperlipidemia, unspecified: Secondary | ICD-10-CM

## 2018-09-16 DIAGNOSIS — G4700 Insomnia, unspecified: Secondary | ICD-10-CM

## 2018-09-16 DIAGNOSIS — H9193 Unspecified hearing loss, bilateral: Secondary | ICD-10-CM | POA: Insufficient documentation

## 2018-09-16 DIAGNOSIS — Z0000 Encounter for general adult medical examination without abnormal findings: Secondary | ICD-10-CM | POA: Insufficient documentation

## 2018-09-16 DIAGNOSIS — Z7189 Other specified counseling: Secondary | ICD-10-CM | POA: Insufficient documentation

## 2018-09-16 DIAGNOSIS — Z7185 Encounter for immunization safety counseling: Secondary | ICD-10-CM | POA: Insufficient documentation

## 2018-09-16 DIAGNOSIS — Z1239 Encounter for other screening for malignant neoplasm of breast: Secondary | ICD-10-CM

## 2018-09-16 LAB — POCT URINALYSIS DIP (PROADVANTAGE DEVICE)
Bilirubin, UA: NEGATIVE
Blood, UA: NEGATIVE
Glucose, UA: NEGATIVE mg/dL
Ketones, POC UA: NEGATIVE mg/dL
Nitrite, UA: NEGATIVE
Protein Ur, POC: NEGATIVE mg/dL
Specific Gravity, Urine: 1.02
Urobilinogen, Ur: NEGATIVE
pH, UA: 6 (ref 5.0–8.0)

## 2018-09-16 NOTE — Patient Instructions (Addendum)
Diabetes  We are checking updated labs today  Check your feet daily for wounds and sores  See your eye doctor yearly, ask them to send [place] a copy of their eye report  See dentist twice yearly  Check your sugars most days in the morning fasting.  Goal is glucose <130  Exercise most days per week, at least 150 minutes per week.  Get a combination of weight bearing exercise and aerobic exercise.    Vaccine counseling: I recommend a yearly flu shot  Shingles vaccine:  I recommend you have a shingles vaccine to help prevent shingles or herpes zoster outbreak.   Please call your insurer to inquire about coverage for the Shingrix vaccine given in 2 doses.   Some insurers cover this vaccine after age 26, some cover this after age 45.  If your insurer covers this, then call to schedule appointment to have this vaccine here.   Cancer screening: Please call to schedule your mammogram   [facility]  [phone] [address], [place], [place] [address]  We sent your pap smear today  We will work on updated colonoscopy referral   Continue your other medications as usual   Hearing screen abnormal  Consider having a formal hearing evaluation

## 2018-09-16 NOTE — Progress Notes (Addendum)
Subjective:   HPI  Stacey Simon is a 58 y.o. female who presents for Chief Complaint  Patient presents with  . CPE    fasting CPE     Patient Care Team: Tysinger, Camelia Eng, PA-C as PCP - General (Family Medicine) Sees dentist Sees eye doctor Orthopedist  Concerns: Asthma-taking Advair 1 puff BID, has neb albuterol and HFA inhaler.   Not having to use albuterol too often.  Maybe uses albuterol once weekly.  Microalbuminuria - taking Losartan 25mg  daily  Hyperlipidemia - taking Rosuvastatin 10mg  daily  Diabetes - not currently taking any medication.   Checks sugars occasionally, not daily or sometimes not weekly.   No foot concerns.  No blurred vision, some polydipsia.  Some polyuria.   Has gained some weight since being at home during coronavirus stay at home measures.     Exercise - walking most days, but knows she needs to do more.   Sometimes walks on the treadmill for 10 minutes, sometimes walks the neighborhood 15 minutes.      Diet -eating 3 meals daily, not snacking on junk.   Eating nuts, doing some smoothies.   Just had left knee surgery recently.   Reviewed their medical, surgical, family, social, medication, and allergy history and updated chart as appropriate.  Past Medical History:  Diagnosis Date  . Allergy   . Anemia    in college  . Asthma    a few prior hospitalizations, last 1992  . Diabetes mellitus without complication ([place]) 05/6107  . Hyperlipidemia   . Insomnia   . Low HDL (under 40) 08/2015  . Microalbuminuria     Past Surgical History:  Procedure Laterality Date  . CESAREAN SECTION    . COLONOSCOPY  09/2015   polyps, Dr. [place]ellar  . COLONOSCOPY  05/2016   [facility], due repeat 09/2018  . CYST EXCISION     left volar foot  . KNEE ARTHROSCOPY W/ MENISCAL REPAIR  10/2017   left  . UMBILICAL HERNIA REPAIR      Social History   Socioeconomic History  . Marital status: Married    Spouse name: Not on file  .  Number of children: Not on file  . Years of education: Not on file  . Highest education level: Not on file  Occupational History  . Not on file  Social Needs  . Financial resource strain: Not on file  . Food insecurity:    Worry: Not on file    Inability: Not on file  . Transportation needs:    Medical: Not on file    Non-medical: Not on file  Tobacco Use  . Smoking status: Never Smoker  . Smokeless tobacco: Never Used  Substance and Sexual Activity  . Alcohol use: No    Alcohol/week: 0.0 standard drinks  . Drug use: No  . Sexual activity: Not on file  Lifestyle  . Physical activity:    Days per week: Not on file    Minutes per session: Not on file  . Stress: Not on file  Relationships  . Social connections:    Talks on phone: Not on file    Gets together: Not on file    Attends religious service: Not on file    Active member of club or organization: Not on file    Attends meetings of clubs or organizations: Not on file    Relationship status: Not on file  . Intimate partner violence:    Fear of  current or ex partner: Not on file    Emotionally abused: Not on file    Physically abused: Not on file    Forced sexual activity: Not on file  Other Topics Concern  . Not on file  Social History Narrative   Married, has 2 children, retired from [employer].   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  Exercise - walk, and exercise with her students.   Has 2nd degree black belt in tae kwon do.   From [place] originally.  09/2018    Family History  Problem Relation Age of Onset  . Asthma Mother   . Hypertension Mother   . Asthma Sister   . Asthma Daughter   . Asthma Son   . Asthma Brother   . Hypertension Maternal Grandmother   . Hypertension Paternal Grandmother   . Hypertension Paternal Grandfather   . Stroke Paternal Grandfather   . Cancer Maternal Aunt        breast  . Colon cancer Neg  Hx      Current Outpatient Medications:  .  ADVAIR DISKUS 500-50 MCG/DOSE AEPB, Inhale 1 puff into the lungs 2 (two) times daily., Disp: 60 each, Rfl: 11 .  albuterol (PROVENTIL) (2.5 MG/3ML) 0.083% nebulizer solution, TAKE 3 MLS BY NEBUILZATION EVERY 6 HOURS AS NEEDED FOR WHEEZING OR SHORTNESS OF BREATH, Disp: 75 mL, Rfl: 0 .  albuterol (VENTOLIN HFA) 108 (90 Base) MCG/ACT inhaler, INHALE 2 PUFFS BY MOUTH INTO THE LUNGS EVERY 6 HOURS AS NEEDED FOR WHEEZE /BREATH SHORTNESS, Disp: 18 Inhaler, Rfl: 0 .  losartan (COZAAR) 25 MG tablet, Take 1 tablet (25 mg total) by mouth daily., Disp: 90 tablet, Rfl: 3 .  rosuvastatin (CRESTOR) 10 MG tablet, TAKE 1 TABLET BY MOUTH EVERYDAY AT BEDTIME, Disp: 90 tablet, Rfl: 3 .  metFORMIN (GLUCOPHAGE) 500 MG tablet, TAKE 1 TABLET BY MOUTH EVERY DAY WITH BREAKFAST, Disp: 90 tablet, Rfl: 3  No Known Allergies   Review of Systems Constitutional: -fever, -chills, -sweats, -unexpected weight change, -decreased appetite, -fatigue Allergy: -sneezing, -itching, -congestion Dermatology: -changing moles, --rash, -lumps ENT: -runny nose, -ear pain, -sore throat, -hoarseness, -sinus pain, -teeth pain, - ringing in ears, -hearing loss, -nosebleeds Cardiology: -chest pain, -palpitations, -swelling, -difficulty breathing when lying flat, -waking up short of breath Respiratory: -cough, -shortness of breath, -difficulty breathing with exercise or exertion, -wheezing, -coughing up blood Gastroenterology: -abdominal pain, -nausea, -vomiting, -diarrhea, +constipation, -blood in stool, -changes in bowel movement, -difficulty swallowing or eating Hematology: -bleeding, -bruising  Musculoskeletal: -joint aches, -muscle aches, -joint swelling, -back pain, -neck pain, -cramping, -changes in gait Ophthalmology: denies vision changes, eye redness, itching, discharge Urology: -burning with urination, -difficulty urinating, -blood in urine, -urinary frequency, -urgency,  -incontinence Neurology: -headache, -weakness, -tingling, -numbness, -memory loss, -falls, -dizziness Psychology: -depressed mood, -agitation, -sleep problems Breast/gyn: -breast tenderness, -discharge, -lumps, -vaginal discharge,- irregular periods, -heavy periods     Objective:  BP 130/80   Pulse 72   Temp (!) 96.8 F (36 C) (Temporal)   Resp 16   Ht 5\' 6"  (1.676 m)   Wt 188 lb 6.4 oz (85.5 kg)   SpO2 100%   BMI 30.41 kg/m   General appearance: alert, no distress, WD/WN, African American female Skin: no unusual lesions HEENT: normocephalic, conjunctiva/corneas normal, sclerae anicteric, PERRLA, EOMi, nares patent, no discharge or erythema, pharynx normal Oral cavity: MMM, tongue normal, teeth in good repair Neck: supple, no lymphadenopathy, no  thyromegaly, no masses, normal ROM, no bruits Chest: non tender, normal shape and expansion Heart: RRR, normal S1, S2, no murmurs Lungs: CTA bilaterally, no wheezes, rhonchi, or rales Abdomen: +bs, soft, non tender, non distended, no masses, no hepatomegaly, no splenomegaly, no bruits Back: non tender, normal ROM, no scoliosis Musculoskeletal: upper extremities non tender, no obvious deformity, normal ROM throughout, lower extremities non tender, no obvious deformity, normal ROM throughout Extremities: no edema, no cyanosis, no clubbing Pulses: 2+ symmetric, upper and lower extremities, normal cap refill Neurological: alert, oriented x 3, CN2-12 intact, strength normal upper extremities and lower extremities, sensation normal throughout, DTRs 2+ throughout, no cerebellar signs, gait normal Psychiatric: normal affect, behavior normal, pleasant  Breast: nontender, no masses or lumps, no skin changes, no nipple discharge or inversion, no axillary lymphadenopathy Gyn: Normal external genitalia without lesions, vagina with normal mucosa, cervix without lesions, no cervical motion tenderness, slight cheesy white abnormal vaginal discharge.  Uterus  and adnexa not enlarged, nontender, no masses.   Pap performed.  Exam chaperoned by nurse. Rectal: anus normal appearing   Assessment and Plan :   Encounter Diagnoses  Name Primary?  . Routine general medical examination at a health care facility Yes  . Encounter for health maintenance examination in adult   . Moderate persistent asthma, unspecified whether complicated   . Allergic rhinitis due to pollen, unspecified seasonality   . Diabetes mellitus with complication ([place])   . Hyperlipidemia, unspecified hyperlipidemia type   . Microalbuminuria   . History of colonic polyps   . Insomnia, unspecified type   . Screening for breast cancer   . Encounter for hepatitis C screening test for low risk patient   . Screening for cervical cancer   . Vaccine counseling   . Decreased hearing of both ears     Physical exam - discussed and counseled on healthy lifestyle, diet, exercise, preventative care, vaccinations, sick and well care, proper use of emergency dept and after hours care, and addressed their concerns.    Health screening: Advised they see their eye doctor yearly for routine vision care. Advised they see their dentist yearly for routine dental care including hygiene visits twice yearly.  Cancer screening Counseled on self breast exams, mammograms, cervical cancer screening  Diabetes - currently not on medication.  F/u pending labs  Microalbuminuria - labs today, c/t ARB  hyperlipidemia - c/t statin, labs today  Asthma - controled on current regimen  Patient Instructions  Diabetes  We are checking updated labs today  Check your feet daily for wounds and sores  See your eye doctor yearly, ask them to send [place] a copy of their eye report  See dentist twice yearly  Check your sugars most days in the morning fasting.  Goal is glucose <130  Exercise most days per week, at least 150 minutes per week.  Get a combination of weight bearing exercise and aerobic exercise.     Vaccine counseling: I recommend a yearly flu shot  Shingles vaccine:  I recommend you have a shingles vaccine to help prevent shingles or herpes zoster outbreak.   Please call your insurer to inquire about coverage for the Shingrix vaccine given in 2 doses.   Some insurers cover this vaccine after age 60, some cover this after age 65.  If your insurer covers this, then call to schedule appointment to have this vaccine here.   Cancer screening: Please call to schedule your mammogram   [facility]  [phone] [address], [place], [place] [address]  We sent your pap smear today  We will work on updated colonoscopy referral   Continue your other medications as usual   Hearing screen abnormal  Consider having a formal hearing evaluation         Jennavieve was seen today for cpe.  Diagnoses and all orders for this visit:  Routine general medical examination at a health care facility -     POCT Urinalysis DIP (Proadvantage Device) -     Comprehensive metabolic panel -     Hemoglobin A1c -     CBC with Differential/Platelet -     Microalbumin/Creatinine Ratio, Urine -     Lipid panel -     Cytology - PAP([place]) -     Hepatitis C antibody  Encounter for health maintenance examination in adult  Moderate persistent asthma, unspecified whether complicated  Allergic rhinitis due to pollen, unspecified seasonality  Diabetes mellitus with complication ([place]) -     Hemoglobin A1c  Hyperlipidemia, unspecified hyperlipidemia type  Microalbuminuria -     Microalbumin/Creatinine Ratio, Urine  History of colonic polyps  Insomnia, unspecified type  Screening for breast cancer -     MM DIGITAL SCREENING BILATERAL; Future  Encounter for hepatitis C screening test for low risk patient -     Hepatitis C antibody  Screening for cervical cancer -     Cytology - PAP([place])  Vaccine counseling  Decreased hearing  of both ears  Other orders -     rosuvastatin (CRESTOR) 10 MG tablet; TAKE 1 TABLET BY MOUTH EVERYDAY AT BEDTIME -     losartan (COZAAR) 25 MG tablet; Take 1 tablet (25 mg total) by mouth daily. -     metFORMIN (GLUCOPHAGE) 500 MG tablet; TAKE 1 TABLET BY MOUTH EVERY DAY WITH BREAKFAST    Follow-up pending labs, yearly for physical

## 2018-09-17 LAB — LIPID PANEL
Chol/HDL Ratio: 2.7 ratio (ref 0.0–4.4)
Cholesterol, Total: 123 mg/dL (ref 100–199)
HDL: 45 mg/dL (ref >39)
LDL Calculated: 59 mg/dL (ref 0–99)
Triglycerides: 96 mg/dL (ref 0–149)
VLDL Cholesterol Cal: 19 mg/dL (ref 5–40)

## 2018-09-17 LAB — COMPREHENSIVE METABOLIC PANEL
ALT: 27 IU/L (ref 0–32)
AST: 22 IU/L (ref 0–40)
Albumin/Globulin Ratio: 1.6 (ref 1.2–2.2)
Albumin: 4.6 g/dL (ref 3.8–4.9)
Alkaline Phosphatase: 76 IU/L (ref 39–117)
BUN/Creatinine Ratio: 14 (ref 9–23)
BUN: 12 mg/dL (ref 6–24)
Bilirubin Total: 0.5 mg/dL (ref 0.0–1.2)
CO2: 22 mmol/L (ref 20–29)
Calcium: 9.7 mg/dL (ref 8.7–10.2)
Chloride: 103 mmol/L (ref 96–106)
Creatinine, Ser: 0.88 mg/dL (ref 0.57–1.00)
GFR calc Af Amer: 83 mL/min/{1.73_m2} (ref >59)
GFR calc non Af Amer: 72 mL/min/{1.73_m2} (ref >59)
Globulin, Total: 2.9 g/dL (ref 1.5–4.5)
Glucose: 167 mg/dL — ABNORMAL HIGH (ref 65–99)
Potassium: 4.4 mmol/L (ref 3.5–5.2)
Sodium: 140 mmol/L (ref 134–144)
Total Protein: 7.5 g/dL (ref 6.0–8.5)

## 2018-09-17 LAB — HEPATITIS C ANTIBODY: Hep C Virus Ab: <0.1 s/co ratio (ref 0.0–0.9)

## 2018-09-17 LAB — CBC WITH DIFFERENTIAL/PLATELET
Basophils Absolute: 0 10*3/uL (ref 0.0–0.2)
Basos: 1 %
EOS (ABSOLUTE): 0.1 10*3/uL (ref 0.0–0.4)
Eos: 1 %
Hematocrit: 38.2 % (ref 34.0–46.6)
Hemoglobin: 12.7 g/dL (ref 11.1–15.9)
Immature Grans (Abs): 0 10*3/uL (ref 0.0–0.1)
Immature Granulocytes: 0 %
Lymphocytes Absolute: 2.5 10*3/uL (ref 0.7–3.1)
Lymphs: 39 %
MCH: 27.3 pg (ref 26.6–33.0)
MCHC: 33.2 g/dL (ref 31.5–35.7)
MCV: 82 fL (ref 79–97)
Monocytes Absolute: 0.6 10*3/uL (ref 0.1–0.9)
Monocytes: 9 %
Neutrophils Absolute: 3.2 10*3/uL (ref 1.4–7.0)
Neutrophils: 50 %
Platelets: 318 10*3/uL (ref 150–450)
RBC: 4.66 x10E6/uL (ref 3.77–5.28)
RDW: 13.2 % (ref 11.7–15.4)
WBC: 6.3 10*3/uL (ref 3.4–10.8)

## 2018-09-17 LAB — HEMOGLOBIN A1C
Est. average glucose Bld gHb Est-mCnc: 151 mg/dL
Hgb A1c MFr Bld: 6.9 % — ABNORMAL HIGH (ref 4.8–5.6)

## 2018-09-17 LAB — MICROALBUMIN / CREATININE URINE RATIO
Creatinine, Urine: 111.9 mg/dL
Microalb/Creat Ratio: 21 mg/g creat (ref 0–29)
Microalbumin, Urine: 23.3 ug/mL

## 2018-09-17 MED ORDER — ROSUVASTATIN CALCIUM 10 MG PO TABS: ORAL_TABLET | ORAL | 3 refills | Status: DC

## 2018-09-17 MED ORDER — LOSARTAN POTASSIUM 25 MG PO TABS: 25.0000 mg | ORAL_TABLET | Freq: Every day | ORAL | 3 refills | Status: DC

## 2018-09-17 MED ORDER — METFORMIN HCL 500 MG PO TABS: ORAL_TABLET | ORAL | 3 refills | Status: DC

## 2018-09-17 NOTE — Addendum Note (Signed)
Addended by: Carlena Hurl on: 09/17/2018 10:41 AM   Modules accepted: Orders

## 2018-09-23 LAB — CYTOLOGY - PAP
Diagnosis: UNDETERMINED — AB
HPV: NOT DETECTED

## 2018-09-24 ENCOUNTER — Other Ambulatory Visit: Payer: Self-pay | Admitting: Medical

## 2018-09-24 MED ORDER — FLUCONAZOLE 100 MG PO TABS: 100.0000 mg | ORAL_TABLET | ORAL | 0 refills | Status: AC

## 2018-09-25 ENCOUNTER — Telehealth: Payer: Self-pay | Admitting: Medical

## 2018-09-25 MED ORDER — LEVALBUTEROL TARTRATE 45 MCG/ACT IN AERO: 2.0000 | INHALATION_SPRAY | Freq: Four times a day (QID) | RESPIRATORY_TRACT | 12 refills | Status: DC | PRN

## 2018-09-25 NOTE — Telephone Encounter (Signed)
Recv'd [facility] from [facility] states Proair/ Ventolin brand not covered, prefers generic,  I called [facility] and advised generic is fine but it is on national back order and no idea when it will be in stock.  Only generic in stock is Levalbuterol and will cost $5.   I called [facility] [phone] to try for P.A. or override since is national back order and had to do P.A.

## 2018-09-25 NOTE — Telephone Encounter (Signed)
P.A. denied, pt must try Levalbuterol .  Is it ok to switch? And if so is the dosage the same

## 2018-09-28 ENCOUNTER — Other Ambulatory Visit: Payer: Self-pay | Admitting: Medical

## 2018-09-28 NOTE — Telephone Encounter (Signed)
Called pt and informed

## 2018-09-28 NOTE — Telephone Encounter (Signed)
It looks like Dr. Redmond School already sent in the Levalbuterol

## 2018-10-01 ENCOUNTER — Telehealth: Payer: Self-pay | Admitting: Medical

## 2018-10-01 NOTE — Telephone Encounter (Signed)
Called one touch lancets and test strips in to [facility] [address]  [id number] with 6 refills on each.

## 2018-10-01 NOTE — Telephone Encounter (Signed)
Pharmacy sent request for a new RX for  one touch lancets and test strips please send to , [id number] [place], [place].

## 2018-10-02 ENCOUNTER — Other Ambulatory Visit: Payer: Self-pay

## 2018-10-02 DIAGNOSIS — E118 Type 2 diabetes mellitus with unspecified complications: Secondary | ICD-10-CM

## 2018-10-02 DIAGNOSIS — J301 Allergic rhinitis due to pollen: Secondary | ICD-10-CM

## 2018-10-02 MED ORDER — GLUCOSE BLOOD VI STRP: ORAL_STRIP | 12 refills | Status: AC

## 2018-10-02 MED ORDER — ONETOUCH ULTRASOFT LANCETS MISC: 12 refills | Status: AC

## 2018-11-24 ENCOUNTER — Other Ambulatory Visit: Payer: Self-pay | Admitting: Medical

## 2018-12-02 ENCOUNTER — Telehealth: Payer: Self-pay | Admitting: Medical

## 2018-12-02 NOTE — Telephone Encounter (Signed)
Pt called and said she has a procedure coming up and was given a list of medications that she can take and Losartan and Rosuvastatn was not on there. She wants to see if she needs to stop those for now or continue taking them she can be reached at [phone]

## 2018-12-03 NOTE — Telephone Encounter (Signed)
What type of procedure is she having?  Has she asked the surgeon directly about those medications?

## 2018-12-04 NOTE — Telephone Encounter (Signed)
Patient is having Colonoscopy.   Gi gave her a list of medications that she could not take and she is not on any of those medications.   I advised her to that she should be able to still take her medications as normal except on the day of her procedure.

## 2018-12-07 NOTE — Telephone Encounter (Signed)
Ok, sounds like you reviewed the "do NOT take list" and advised her of such.

## 2018-12-08 LAB — HM COLONOSCOPY

## 2018-12-28 ENCOUNTER — Other Ambulatory Visit: Payer: Self-pay | Admitting: Medical

## 2018-12-28 NOTE — Telephone Encounter (Signed)
Is this ok to refill?  

## 2019-02-16 ENCOUNTER — Other Ambulatory Visit: Payer: Self-pay | Admitting: Medical

## 2019-04-16 ENCOUNTER — Telehealth: Payer: Self-pay | Admitting: Medical

## 2019-04-16 ENCOUNTER — Other Ambulatory Visit: Payer: Self-pay | Admitting: Medical

## 2019-04-16 DIAGNOSIS — J4540 Moderate persistent asthma, uncomplicated: Secondary | ICD-10-CM

## 2019-04-16 MED ORDER — ALBUTEROL SULFATE HFA 108 (90 BASE) MCG/ACT IN AERS: INHALATION_SPRAY | RESPIRATORY_TRACT | 0 refills | Status: DC

## 2019-04-16 MED ORDER — ADVAIR DISKUS 500-50 MCG/DOSE IN AEPB: 1.0000 | INHALATION_SPRAY | Freq: Two times a day (BID) | RESPIRATORY_TRACT | 11 refills | Status: DC

## 2019-04-16 NOTE — Telephone Encounter (Signed)
I refilled albuterol and Advair. At this point if having to use albuterol more than 3 times per week she needs to be back on Advair as well for prevention

## 2019-04-16 NOTE — Telephone Encounter (Signed)
Pt called and is requesting refill on allbuterol inhaler [id number] [place], [place] [facility] [place].

## 2019-04-19 ENCOUNTER — Ambulatory Visit (INDEPENDENT_AMBULATORY_CARE_PROVIDER_SITE_OTHER): Payer: BC Managed Care – PPO | Admitting: Medical

## 2019-04-19 ENCOUNTER — Encounter: Payer: Self-pay | Admitting: Medical

## 2019-04-19 ENCOUNTER — Other Ambulatory Visit: Payer: Self-pay

## 2019-04-19 VITALS — Ht 66.5 in | Wt 182.0 lb

## 2019-04-19 DIAGNOSIS — R002 Palpitations: Secondary | ICD-10-CM | POA: Diagnosis not present

## 2019-04-19 DIAGNOSIS — J4541 Moderate persistent asthma with (acute) exacerbation: Secondary | ICD-10-CM | POA: Diagnosis not present

## 2019-04-19 DIAGNOSIS — R062 Wheezing: Secondary | ICD-10-CM | POA: Diagnosis not present

## 2019-04-19 DIAGNOSIS — Z77120 Contact with and (suspected) exposure to mold (toxic): Secondary | ICD-10-CM | POA: Insufficient documentation

## 2019-04-19 DIAGNOSIS — E118 Type 2 diabetes mellitus with unspecified complications: Secondary | ICD-10-CM

## 2019-04-19 MED ORDER — MONTELUKAST SODIUM 10 MG PO TABS: 10.0000 mg | ORAL_TABLET | Freq: Every day | ORAL | 3 refills | Status: DC

## 2019-04-19 NOTE — Progress Notes (Signed)
This visit type was conducted due to national recommendations for restrictions regarding the COVID-19 Pandemic (e.g. social distancing) in an effort to limit this patient's exposure and mitigate transmission in our community.  Due to their co-morbid illnesses, this patient is at least at moderate risk for complications without adequate follow up.  This format is felt to be most appropriate for this patient at this time.    Documentation for virtual audio and video telecommunications through Zoom encounter:  The patient was located at home. The provider was located in the office. The patient did consent to this visit and is aware of possible charges through their insurance for this visit.  The other persons participating in this telemedicine service were none. Time spent on call was 20 minutes and in review of previous records >20 minutes total.  This virtual service is not related to other E/M service within previous 7 days.  Subjective: Chief Complaint  Patient presents with  . Wheezing    with sob xfew months on and off    Virtual consult today for shortness of breath and wheezing  She has a history of asthma, diabetes, allergies, insomnia, hyperlipidemia.  She has been using Advair 50/500 at least once daily and sometimes twice daily for the last several months.  She sometimes forgets the evening dose.  She also uses albuterol rescue inhaler but lately felt like she has been having to use this more than usual.  She is feeling some shortness of breath and wheezing.   About a month ago she was lying in bed after using the Advair and before she went to sleep her heart was beating fast.  She got up and felt wheezy.  This eventually calmed down.  In the last few days has been feeling wheezy again.  There are a lot of trees in her yard which she wonders about dust and pollen aggravating her asthma.  She also has questions about possible mold in her house.  She denies syncope, no extremely fast  heart rate.  She is not using caffeine.  No alcohol.  No edema.  She is not checking blood sugars regularly.    She ended up going to urgent care at [facility] on [address] this past weekend but given their restrictions this was also a virtual consult.  Prednisone was called out.  03/23/19 was exposed to + covid, she went for test and she ended up having negative test.  Her test date was 03/29/2019.  Past Medical History:  Diagnosis Date  . Allergy   . Anemia    in college  . Asthma    a few prior hospitalizations, last 1992  . Diabetes mellitus without complication ([place]) Q000111Q  . Hyperlipidemia   . Insomnia   . Low HDL (under 40) 08/2015  . Microalbuminuria    Current Outpatient Medications on File Prior to Visit  Medication Sig Dispense Refill  . albuterol (VENTOLIN HFA) 108 (90 Base) MCG/ACT inhaler INHALE 2 PUFFS BY MOUTH EVERY 6 HOURS AS NEEDED FOR WHEEZE /BREATH SHORTNESS 18 g 0  . glucose blood test strip Use as instructed 100 each 12  . Lancets (ONETOUCH ULTRASOFT) lancets Use as instructed 100 each 12  . levalbuterol (XOPENEX HFA) 45 MCG/ACT inhaler Inhale 2 puffs into the lungs every 6 (six) hours as needed for wheezing. 1 Inhaler 12  . losartan (COZAAR) 25 MG tablet Take 1 tablet (25 mg total) by mouth daily. 90 tablet 3  . rosuvastatin (CRESTOR) 10 MG tablet  TAKE 1 TABLET BY MOUTH EVERYDAY AT BEDTIME 90 tablet 3  . ADVAIR DISKUS 500-50 MCG/DOSE AEPB Inhale 1 puff into the lungs 2 (two) times daily. (Patient not taking: Reported on 04/19/2019) 60 each 11  . albuterol (PROVENTIL) (2.5 MG/3ML) 0.083% nebulizer solution TAKE 3 MLS BY NEBUILZATION EVERY 6 HOURS AS NEEDED FOR WHEEZING OR SHORTNESS OF BREATH (Patient not taking: Reported on 04/19/2019) 75 mL 0  . metFORMIN (GLUCOPHAGE) 500 MG tablet TAKE 1 TABLET BY MOUTH EVERY DAY WITH BREAKFAST (Patient not taking: Reported on 04/19/2019) 90 tablet 3   No current facility-administered medications on file prior to visit.    ROS  as in subjective    Objective Ht 5' 6.5" (1.689 m)   Wt 182 lb (82.6 kg)   BMI 28.94 kg/m   Gen:wd, wn, nad Not dyspneic, no labored breathing Not examined in person as this was video televisit     Assessment: Encounter Diagnoses  Name Primary?  . Moderate persistent asthma with acute exacerbation Yes  . Wheezing   . Mold exposure   . Palpitation   . Diabetes mellitus with complication ([place])      Plan: We discussed the limitations of virtual consult  I suspect asthma is the main issue still, possibly aggravated by environmental allergens including dust, and likely mold in the house.  Based on pictures and video she showed me of one particular wall, it appears she has an active mold issue.  I advise she get a professional to come in and evaluate the situation.  For now continue Advair twice daily, albuterol rescue inhaler 2 puffs every 4-6 hours rescue inhaler, begin Singulair allergy pill  Advise she return soon for diabetes follow-up, EKG and labs     Addalee was seen today for wheezing.  Diagnoses and all orders for this visit:  Moderate persistent asthma with acute exacerbation  Wheezing  Mold exposure  Palpitation  Diabetes mellitus with complication ([place])  Other orders -     montelukast (SINGULAIR) 10 MG tablet; Take 1 tablet (10 mg total) by mouth at bedtime.

## 2019-04-19 NOTE — Telephone Encounter (Signed)
Pt has a virtual appt today 04/19/19 for a virtual appt

## 2019-04-25 ENCOUNTER — Other Ambulatory Visit: Payer: Self-pay

## 2019-04-25 ENCOUNTER — Emergency Department (HOSPITAL_COMMUNITY)
Admission: EM | Admit: 2019-04-25 | Discharge: 2019-04-25 | Disposition: A | Discharge status: Home / Self Care | Payer: BC Managed Care – PPO | Attending: Emergency Medicine | Admitting: Emergency Medicine

## 2019-04-25 ENCOUNTER — Encounter (HOSPITAL_COMMUNITY): Payer: Self-pay

## 2019-04-25 ENCOUNTER — Emergency Department (HOSPITAL_COMMUNITY): Payer: BC Managed Care – PPO

## 2019-04-25 DIAGNOSIS — Z79899 Other long term (current) drug therapy: Secondary | ICD-10-CM | POA: Diagnosis not present

## 2019-04-25 DIAGNOSIS — J45909 Unspecified asthma, uncomplicated: Secondary | ICD-10-CM | POA: Insufficient documentation

## 2019-04-25 DIAGNOSIS — E119 Type 2 diabetes mellitus without complications: Secondary | ICD-10-CM | POA: Insufficient documentation

## 2019-04-25 DIAGNOSIS — Z7984 Long term (current) use of oral hypoglycemic drugs: Secondary | ICD-10-CM | POA: Diagnosis not present

## 2019-04-25 DIAGNOSIS — R079 Chest pain, unspecified: Secondary | ICD-10-CM | POA: Insufficient documentation

## 2019-04-25 LAB — CBC
HCT: 39 % (ref 36.0–46.0)
Hemoglobin: 12.6 g/dL (ref 12.0–15.0)
MCH: 27.5 pg (ref 26.0–34.0)
MCHC: 32.3 g/dL (ref 30.0–36.0)
MCV: 85 fL (ref 80.0–100.0)
Platelets: 314 10*3/uL (ref 150–400)
RBC: 4.59 MIL/uL (ref 3.87–5.11)
RDW: 13.3 % (ref 11.5–15.5)
WBC: 6.4 10*3/uL (ref 4.0–10.5)
nRBC: 0 % (ref 0.0–0.2)

## 2019-04-25 LAB — BASIC METABOLIC PANEL
Anion gap: 9 (ref 5–15)
BUN: 11 mg/dL (ref 6–20)
CO2: 27 mmol/L (ref 22–32)
Calcium: 8.8 mg/dL — ABNORMAL LOW (ref 8.9–10.3)
Chloride: 104 mmol/L (ref 98–111)
Creatinine, Ser: 0.81 mg/dL (ref 0.44–1.00)
GFR calc Af Amer: >60 mL/min (ref >60)
GFR calc non Af Amer: >60 mL/min (ref >60)
Glucose, Bld: 144 mg/dL — ABNORMAL HIGH (ref 70–99)
Potassium: 3.9 mmol/L (ref 3.5–5.1)
Sodium: 140 mmol/L (ref 135–145)

## 2019-04-25 LAB — TROPONIN I (HIGH SENSITIVITY)
Troponin I (High Sensitivity): 3 ng/L (ref <18)
Troponin I (High Sensitivity): 2 ng/L (ref <18)

## 2019-04-25 MED ORDER — ALBUTEROL SULFATE HFA 108 (90 BASE) MCG/ACT IN AERS
2.0000 | INHALATION_SPRAY | Freq: Once | RESPIRATORY_TRACT | Status: AC
  Administered 2019-04-25: 2 via RESPIRATORY_TRACT
  Filled 2019-04-25: qty 6.7

## 2019-04-25 MED ORDER — SODIUM CHLORIDE 0.9% FLUSH: 3.0000 mL | Freq: Once | INTRAVENOUS | Status: DC

## 2019-04-25 NOTE — ED Provider Notes (Signed)
[facility] DEPT Provider Note   CSN: [id number] Arrival date & time: 04/25/19  1213     History Chief Complaint  Patient presents with  . Chest Pain    Stacey Simon is a 58 y.o. female with a history of T2DM, hyperlipidemia, and asthma who presents to the emergency department with complaints of chest discomfort that began at 10:30 AM and is improving at present.  Patient states that she was laying down watching TV when she developed a pressure to the generalized anterior chest, this was moderate in severity, no alleviating or aggravating factors were noted, she had no change with exertion, a deep breath, or position.  She states it seems to be improved currently and is a 3 out of 10 in severity, nothing specific seem to improve this.  No intervention prior to arrival.  She does not think that she has ever had pain like this before, she states it may have felt like her prior asthma but is not sure.  She did just recently finished a course of steroids for an asthma exacerbation a few days ago.  She denies fever, chills, nausea, vomiting, diaphoresis, family history of early CAD, personal history of CAD, dyspnea, cough, wheezing, leg pain/swelling, hemoptysis, recent surgery/trauma, recent long travel, hormone use, personal hx of cancer, or hx of DVT/PE.  She states she had a stress test several years ago that was normal.     HPI     Past Medical History:  Diagnosis Date  . Allergy   . Anemia    in college  . Asthma    a few prior hospitalizations, last 1992  . Diabetes mellitus without complication ([place]) Q000111Q  . Hyperlipidemia   . Insomnia   . Low HDL (under 40) 08/2015  . Microalbuminuria     Patient Active Problem List   Diagnosis Date Noted  . Moderate persistent asthma with acute exacerbation 04/19/2019  . Wheezing 04/19/2019  . Mold exposure 04/19/2019  . Palpitation 04/19/2019  . Routine general medical examination at a  health care facility 09/16/2018  . Screening for cervical cancer 09/16/2018  . Vaccine counseling 09/16/2018  . Decreased hearing of both ears 09/16/2018  . Microalbuminuria 10/27/2017  . Hyperlipidemia 05/26/2017  . Need for influenza vaccination 02/19/2017  . Diabetes mellitus with complication ([place]) [id number]  . Low serum HDL 11/27/2015  . History of colonic polyps 11/27/2015  . Bunion of great toe 11/27/2015  . Pre-ulcerative corn or callous 11/27/2015  . Asthma, moderate persistent 06/28/2015  . Rhinitis, allergic 06/28/2015  . Insomnia 06/28/2015  . Influenza vaccination declined 06/28/2015    Past Surgical History:  Procedure Laterality Date  . CESAREAN SECTION    . COLONOSCOPY  09/2015   polyps, Dr. Dixon Cellar  . COLONOSCOPY  05/2016   [facility], due repeat 09/2018  . CYST EXCISION     left volar foot  . KNEE ARTHROSCOPY W/ MENISCAL REPAIR  10/2017   left  . UMBILICAL HERNIA REPAIR       OB History   No obstetric history on file.     Family History  Problem Relation Age of Onset  . Asthma Mother   . Hypertension Mother   . Asthma Sister   . Asthma Daughter   . Asthma Son   . Asthma Brother   . Hypertension Maternal Grandmother   . Hypertension Paternal Grandmother   . Hypertension Paternal Grandfather   . Stroke Paternal Grandfather   . Cancer  Maternal Aunt        breast  . Colon cancer Neg Hx     Social History   Tobacco Use  . Smoking status: Never Smoker  . Smokeless tobacco: Never Used  Substance Use Topics  . Alcohol use: No    Alcohol/week: 0.0 standard drinks  . Drug use: No    Home Medications Prior to Admission medications   Medication Sig Start Date End Date Taking? Authorizing Provider  albuterol (VENTOLIN HFA) 108 (90 Base) MCG/ACT inhaler INHALE 2 PUFFS BY MOUTH EVERY 6 HOURS AS NEEDED FOR WHEEZE /BREATH SHORTNESS 04/16/19  Yes Tysinger, Camelia Eng, PA-C  losartan (COZAAR) 25 MG tablet Take 1 tablet (25 mg total) by mouth  daily. 09/17/18 09/17/19 Yes Tysinger, Camelia Eng, PA-C  rosuvastatin (CRESTOR) 10 MG tablet TAKE 1 TABLET BY MOUTH EVERYDAY AT BEDTIME Patient taking differently: Take 10 mg by mouth daily. TAKE 1 TABLET BY MOUTH EVERYDAY AT BEDTIME 09/17/18  Yes Tysinger, Camelia Eng, PA-C  ADVAIR DISKUS 500-50 MCG/DOSE AEPB Inhale 1 puff into the lungs 2 (two) times daily. 04/16/19 04/15/20  Tysinger, Camelia Eng, PA-C  albuterol (PROVENTIL) (2.5 MG/3ML) 0.083% nebulizer solution TAKE 3 MLS BY NEBUILZATION EVERY 6 HOURS AS NEEDED FOR WHEEZING OR SHORTNESS OF BREATH Patient not taking: Reported on 04/19/2019 08/13/18   Carlena Hurl, PA-C  glucose blood test strip Use as instructed 10/02/18   Tysinger, Camelia Eng, PA-C  Lancets [facility] ULTRASOFT) lancets Use as instructed 10/02/18   Tysinger, Camelia Eng, PA-C  levalbuterol [facility] HFA) 45 MCG/ACT inhaler Inhale 2 puffs into the lungs every 6 (six) hours as needed for wheezing. Patient not taking: Reported on 04/25/2019 09/25/18   Denita Lung, MD  metFORMIN (GLUCOPHAGE) 500 MG tablet TAKE 1 TABLET BY MOUTH EVERY DAY WITH BREAKFAST Patient not taking: Reported on 04/19/2019 09/17/18   Tysinger, Camelia Eng, PA-C  montelukast (SINGULAIR) 10 MG tablet Take 1 tablet (10 mg total) by mouth at bedtime. 04/19/19   Tysinger, Camelia Eng, PA-C    Allergies    Patient has no known allergies.  Review of Systems   Review of Systems  Constitutional: Negative for chills, diaphoresis and fever.  Respiratory: Negative for cough and shortness of breath.   Cardiovascular: Positive for chest pain. Negative for palpitations and leg swelling.  Gastrointestinal: Negative for abdominal pain, nausea and vomiting.  Musculoskeletal: Negative for myalgias.  Neurological: Negative for dizziness, syncope, weakness and numbness.  All other systems reviewed and are negative.   Physical Exam Updated Vital Signs BP 140/74   Pulse (!) 58   Temp 98.4 F (36.9 C) (Oral)   Resp 12   Ht 5' 6.5" (1.689 m)   Wt 82.6  kg   SpO2 100%   BMI 28.94 kg/m   Physical Exam Vitals and nursing note reviewed.  Constitutional:      General: She is not in acute distress.    Appearance: She is well-developed. She is not toxic-appearing.  HENT:     Head: Normocephalic and atraumatic.  Eyes:     General:        Right eye: No discharge.        Left eye: No discharge.     Conjunctiva/sclera: Conjunctivae normal.  Cardiovascular:     Rate and Rhythm: Normal rate and regular rhythm.     Pulses:          Radial pulses are 2+ on the right side and 2+ on the left side.  Posterior tibial pulses are 2+ on the right side and 2+ on the left side.  Pulmonary:     Effort: Pulmonary effort is normal. No respiratory distress.     Breath sounds: Normal breath sounds. No wheezing, rhonchi or rales.  Chest:     Chest wall: No tenderness.  Abdominal:     General: There is no distension.     Palpations: Abdomen is soft.     Tenderness: There is no abdominal tenderness.  Musculoskeletal:     Cervical back: Neck supple.     Right lower leg: No edema.     Left lower leg: No edema.  Skin:    General: Skin is warm and dry.     Findings: No rash.  Neurological:     Mental Status: She is alert.     Comments: Clear speech.   Psychiatric:        Behavior: Behavior normal.     ED Results / Procedures / Treatments   Labs (all labs ordered are listed, but only abnormal results are displayed) Labs Reviewed  BASIC METABOLIC PANEL - Abnormal; Notable for the following components:      Result Value   Glucose, Bld 144 (*)    Calcium 8.8 (*)    All other components within normal limits  CBC  TROPONIN I (HIGH SENSITIVITY)  TROPONIN I (HIGH SENSITIVITY)    EKG None      Date: 04/25/2019  Rate: 68 bpm  Rhythm: sinus rhythm  QRS Axis: normal  Intervals: normal  ST/T Wave abnormalities: no significant ischemic changes.   Narrative Interpretation: sinus rhythm, rsr' in V1/V2 Reviewed with supervising physician  Dr. Kathrynn Humble.   Radiology DG Chest 2 View  Result Date: 04/25/2019 CLINICAL DATA:  Chest pain. EXAM: CHEST - 2 VIEW COMPARISON:  January 11, 2016. FINDINGS: The heart size and mediastinal contours are within normal limits. Both lungs are clear. No pneumothorax or pleural effusion is noted. The visualized skeletal structures are unremarkable. IMPRESSION: No active cardiopulmonary disease. Electronically Signed   By: Marijo Conception M.D.   On: 04/25/2019 13:36    Procedures Procedures (including critical care time)  Medications Ordered in ED Medications  sodium chloride flush (NS) 0.9 % injection 3 mL (has no administration in time range)  albuterol (VENTOLIN HFA) 108 (90 Base) MCG/ACT inhaler 2 puff (has no administration in time range)    ED Course  I have reviewed the triage vital signs and the nursing notes.  Pertinent labs & imaging results that were available during my care of the patient were reviewed by me and considered in my medical decision making (see chart for details).    Patient presents to the emergency department with chest pain. Patient nontoxic appearing, in no apparent distress, vitals with elevated BP which is downtrending while in the ED, otherwise unremarkable. Fairly benign physical exam. DDX: ACS, pulmonary embolism, dissection, pneumothorax, bronchospasms, pnuemonia, arrhythmia, anemia, electrolyte derangement, MSK, GERD, anxiety. Evaluation initiated with labs, EKG, and CXR. Patient on cardiac monitor.   Work-up in the ER reviewed:  CBC: No anemia/leukocytosis.  BMP: Mild hypocalcemia, no significant electrolyte derangement. Hyperglycemia without anion gap elevation or acidosis. Renal function WNL.  Troponin: 3, 2 EKG: No significant ischemic changes CXR:  Negative, without infiltrate, effusion, pneumothorax, or fracture/dislocation.   Heart pathway score of 4, downtrending troponins 3,2 no significant elevations, EKG without obvious ischemia, doubt ACS.  Patient is low risk wells, doubt pulmonary embolism. Pain is not a tearing sensation, symmetric  pulses, no widening of mediastinum on CXR, doubt dissection. Cardiac monitor reviewed, no notable arrhythmias or tachycardia. Mild improvement s/p albuterol inhaler trial. Unclear definitive etiology. Patient has appeared hemodynamically stable throughout ER visit and appears safe for discharge with close PCP/cardiology follow up. I discussed results, treatment plan, need for PCP follow-up, and return precautions with the patient. Provided opportunity for questions, patient confirmed understanding and is in agreement with plan.   Findings and plan of care discussed with supervising physician Dr. Kathrynn Humble who is in agreement.   Blood pressure 140/74, pulse (!) 58, temperature 98.4 F (36.9 C), temperature source Oral, resp. rate 12, height 5' 6.5" (1.689 m), weight 82.6 kg, SpO2 100 %.  Final Clinical Impression(s) / ED Diagnoses Final diagnoses:  Chest pain, unspecified type    Rx / DC Orders ED Discharge Orders    None       Amaryllis Dyke, PA-C 04/25/19 Gibson, Ankit, MD 04/25/19 1904

## 2019-04-25 NOTE — ED Triage Notes (Signed)
Patient states she was lying down and began having mid chest pain x 3 hours ago. Pain has lessened since coming to the ED. Patient denies any SOB.

## 2019-04-25 NOTE — ED Notes (Signed)
ED Provider at bedside. 

## 2019-04-25 NOTE — Discharge Instructions (Signed)
You were seen in the emergency department today for chest pain. Your work-up in the emergency department has been overall reassuring. Your labs have been fairly normal and or similar to previous blood work you have had done. Your blood sugar was somewhat elevated and your calcium was a bit low- please be sure to include calcium rich foods in your diet, have these rechecked by primary care. Your EKG and the enzyme we use to check your heart did not show an acute heart attack at this time. Your chest x-ray was normal.   We would like you to follow up closely with your primary care provider and/or the cardiologist provided in your discharge instructions within 1-3 days. Return to the ER immediately should you experience any new or worsening symptoms including but not limited to return of pain, worsened pain, vomiting, shortness of breath, dizziness, lightheadedness, passing out, or any other concerns that you may have.

## 2019-05-04 ENCOUNTER — Telehealth: Payer: Self-pay | Admitting: Medical

## 2019-05-04 ENCOUNTER — Other Ambulatory Visit: Payer: Self-pay | Admitting: Medical

## 2019-05-04 NOTE — Telephone Encounter (Signed)
Please call  Pt has questions about her meds

## 2019-06-22 ENCOUNTER — Telehealth: Payer: Self-pay | Admitting: Medical

## 2019-06-22 NOTE — Telephone Encounter (Signed)
Pt called and wanted to make sure it is safe for her and her husband to receive the Covid vaccine. They are both scheduled to get it on friday

## 2019-06-24 NOTE — Telephone Encounter (Signed)
I do recommend the Covid vaccine for her and her husband.  I do not see any reason why she cannot get the vaccine from a safety standpoint.  This vaccine should not be done in conjunction with any other vaccine within 2 weeks of the dosing.  The chart record does not show any history of significant vaccine allergy for her husband.

## 2019-06-25 ENCOUNTER — Ambulatory Visit: Payer: BC Managed Care – PPO | Attending: Internal Medicine

## 2019-06-25 ENCOUNTER — Ambulatory Visit: Payer: BC Managed Care – PPO

## 2019-06-25 DIAGNOSIS — Z23 Encounter for immunization: Secondary | ICD-10-CM

## 2019-06-25 NOTE — Progress Notes (Signed)
   Covid-19 Vaccination Clinic  Name:  Stacey Simon    MRN: [id number] DOB: [birth year]  06/25/2019  Ms. [name] was observed post Covid-19 immunization for 15 minutes without incidence. She was provided with Vaccine Information Sheet and instruction to access the V-Safe system.   Ms. [name] was instructed to call 911 with any severe reactions post vaccine: Marland Kitchen Difficulty breathing  . Swelling of your face and throat  . A fast heartbeat  . A bad rash all over your body  . Dizziness and weakness    Immunizations Administered    Name Date Dose VIS Date Route   Pfizer COVID-19 Vaccine 06/25/2019  3:21 PM 0.3 mL 04/23/2019 Intramuscular   Manufacturer: [place]   Lot: [id number]   [place]: [id number]

## 2019-07-18 ENCOUNTER — Ambulatory Visit: Payer: BC Managed Care – PPO | Attending: Internal Medicine

## 2019-07-18 DIAGNOSIS — Z23 Encounter for immunization: Secondary | ICD-10-CM | POA: Insufficient documentation

## 2019-07-18 NOTE — Progress Notes (Signed)
   Covid-19 Vaccination Clinic  Name:  Stacey Simon    MRN: [id number] DOB: 1960-08-28  07/18/2019  [name] was observed post Covid-19 immunization for 15 minutes without incident. She was provided with Vaccine Information Sheet and instruction to access the V-Safe system.   [name] was instructed to call 911 with any severe reactions post vaccine: Marland Kitchen Difficulty breathing  . Swelling of face and throat  . A fast heartbeat  . A bad rash all over body  . Dizziness and weakness   Immunizations Administered    Name Date Dose VIS Date Route   Pfizer COVID-19 Vaccine 07/18/2019  8:20 AM 0.3 mL 04/23/2019 Intramuscular   Manufacturer: [place]   Lot: [id number]   [place]: [id number]

## 2019-08-02 ENCOUNTER — Ambulatory Visit
Admission: RE | Admit: 2019-08-02 | Discharge: 2019-08-02 | Disposition: A | Discharge status: Home / Self Care | Payer: BC Managed Care – PPO | Source: Ambulatory Visit | Attending: Medical | Admitting: Medical

## 2019-08-02 ENCOUNTER — Other Ambulatory Visit: Payer: Self-pay

## 2019-08-02 DIAGNOSIS — Z1239 Encounter for other screening for malignant neoplasm of breast: Secondary | ICD-10-CM

## 2019-10-22 ENCOUNTER — Other Ambulatory Visit: Payer: Self-pay | Admitting: Medical

## 2019-11-10 ENCOUNTER — Other Ambulatory Visit: Payer: Self-pay | Admitting: Medical

## 2019-11-10 ENCOUNTER — Telehealth: Payer: Self-pay | Admitting: Medical

## 2019-11-10 MED ORDER — ALBUTEROL SULFATE HFA 108 (90 BASE) MCG/ACT IN AERS: 2.0000 | INHALATION_SPRAY | Freq: Four times a day (QID) | RESPIRATORY_TRACT | 1 refills | Status: DC | PRN

## 2019-11-10 NOTE — Telephone Encounter (Signed)
Pt called and said she is out of her Albuterol inhaler and the pharmacy said she cant get it refilled until Sunday. We do not have samples here so pt wanted to know is there anything she can do in the mean time?

## 2019-11-10 NOTE — Telephone Encounter (Signed)
The message does not make any sense.  She has not gotten a refill from me recently, so unless the pharmacy is just out of the inhalers there is no restrictions on her getting this medication today.  I sent a refill on albuterol.  She should still be using Advair.  If the pharmacy is out of inhalers I would recommend she try different pharmacy

## 2019-11-10 NOTE — Telephone Encounter (Signed)
Pt advised.

## 2019-12-24 ENCOUNTER — Telehealth: Payer: Self-pay

## 2019-12-24 ENCOUNTER — Other Ambulatory Visit: Payer: Self-pay

## 2019-12-24 MED ORDER — ROSUVASTATIN CALCIUM 10 MG PO TABS: ORAL_TABLET | ORAL | 0 refills | Status: DC

## 2019-12-24 MED ORDER — LOSARTAN POTASSIUM 25 MG PO TABS: 25.0000 mg | ORAL_TABLET | Freq: Every day | ORAL | 0 refills | Status: DC

## 2019-12-24 NOTE — Telephone Encounter (Signed)
DONE

## 2019-12-24 NOTE — Telephone Encounter (Signed)
Pt. Called stating that she tried to pick her prescriptions at [facility] and they told her they never received them and I called them and they told me the same thing. It was for Losartan and the pts. Rosuvastatin if you could refill those for her. Pt. Last apt. 04/19/19.

## 2020-01-05 ENCOUNTER — Other Ambulatory Visit: Payer: Self-pay

## 2020-01-05 ENCOUNTER — Telehealth: Payer: BC Managed Care – PPO | Admitting: Medical

## 2020-01-05 ENCOUNTER — Encounter: Payer: Self-pay | Admitting: Medical

## 2020-01-05 VITALS — Ht 66.0 in | Wt 180.0 lb

## 2020-01-05 DIAGNOSIS — J029 Acute pharyngitis, unspecified: Secondary | ICD-10-CM

## 2020-01-05 DIAGNOSIS — R059 Cough, unspecified: Secondary | ICD-10-CM

## 2020-01-05 DIAGNOSIS — R110 Nausea: Secondary | ICD-10-CM | POA: Diagnosis not present

## 2020-01-05 DIAGNOSIS — R05 Cough: Secondary | ICD-10-CM | POA: Diagnosis not present

## 2020-01-05 DIAGNOSIS — U071 COVID-19: Secondary | ICD-10-CM

## 2020-01-05 MED ORDER — EMERGEN-C IMMUNE PLUS PO PACK: 1.0000 | PACK | Freq: Two times a day (BID) | ORAL | 0 refills | Status: DC

## 2020-01-05 MED ORDER — ALBUTEROL SULFATE (2.5 MG/3ML) 0.083% IN NEBU: 2.5000 mg | INHALATION_SOLUTION | Freq: Four times a day (QID) | RESPIRATORY_TRACT | 1 refills | Status: DC | PRN

## 2020-01-05 MED ORDER — PROMETHAZINE-DM 6.25-15 MG/5ML PO SYRP: 5.0000 mL | ORAL_SOLUTION | Freq: Four times a day (QID) | ORAL | 0 refills | Status: DC | PRN

## 2020-01-05 NOTE — Progress Notes (Signed)
Subjective:     Patient ID: Stacey Simon, female   DOB: [birth year], 59 y.o.   MRN: [id number]  This visit type was conducted due to national recommendations for restrictions regarding the COVID-19 Pandemic (e.g. social distancing) in an effort to limit this patient's exposure and mitigate transmission in our community.  Due to their co-morbid illnesses, this patient is at least at moderate risk for complications without adequate follow up.  This format is felt to be most appropriate for this patient at this time.    Documentation for virtual audio and video telecommunications through [place] encounter:  The patient was located at home. The provider was located in the office. The patient did consent to this visit and is aware of possible charges through their insurance for this visit.  The other persons participating in this telemedicine service were none. Time spent on call was 20 minutes and in review of previous records 20 minutes total.  This virtual service is not related to other E/M service within previous 7 days.   HPI Chief Complaint  Patient presents with  . Follow-up    +COVID with cough,chills,headacheand nausea    Virtual consult today for Covid positive.  She has been vaccinated earlier this year with both doses.  She tested positive for days ago with Covid.  She denies sick contacts.  She notes cough, chills, headache, nausea.  Some runny nose.  No shortness of breath, no fever no body aches.  She has not had to use her inhaler yet.  She is hydrating well up to about a gallon per day in general.  Otherwise was in her usual state of health.  Overall feels pretty good currently.  She would like something to help with cough.  No other aggravating or relieving factors. No other complaint.  Review of Systems As in subjective    Objective:   Physical Exam Due to coronavirus pandemic stay at home measures, patient visit was virtual and they were not  examined in person.   Ht 5\' 6"  (1.676 m)   Wt 180 lb (81.6 kg)   BMI 29.05 kg/m       Assessment:     Encounter Diagnoses  Name Primary?  . COVID-19 virus infection Yes  . Cough   . Nausea   . Sore throat        Plan:     We discussed her underlying risk factors.  We discussed positive diagnosis.  We will refer to infusion therapy clinic.  She has a home nebulizer so I refilled some albuterol for nebulizer.  She will continue her other medicines as usual.  General recommendations: I recommend you rest, hydrate well with water and clear fluids throughout the day.   Drink enough water and clear fluids so that your urine is clear.   You can use Tylenol for pain or fever every 4-6 hours.  Consider using over the counter Emergen-C Immune + over the counter for the next week.  If you need any medications from the pharmacy, have a friend or family member pick them up from the pharmacy for you.  Have them drop off the medications at your home to keep you from having to go into the pharmacy and potentially exposure others.   If this is not possible, see if your pharmacy does home delivery.  Or worse case scenario, go through the drive through at your pharmacy, wear your mask, use hand sanitizer before touching anything in the drive through transaction, and  limit interaction with the store personally, particularly staying > 6 feet apart.  Over the next few days if you are having worse trouble breathing, if you are very weak, have persistent fever 101 or higher consistently despite Tylenol, uncontrollable nausea and vomiting, or feel very dehydrated, then call or go to the emergency department.    If you have other questions or have other symptoms or questions you are concerned about then please make a virtual visit with your primary care provider.  Covid symptoms such as fatigue and cough can linger over 2 weeks, even after the initial fever, aches, chills, and other initial  symptoms.   Self Quarantine: The CDC, Centers for Disease Control has recommended a self quarantine of 10 -14 days from the start of your illness until you are symptom-free including at least 24 hours of no symptoms including no fever, no shortness of breath, and no body aches and chills, by day 10 before returning to work or general contact with the public.  What does self quarantine mean: avoiding contact with people as much as possible.   Particularly in your house, isolate your self from others in a separate room, wear a mask when possible in the room, particularly if coughing a lot.   Have others bring food, water, medications, etc., to your door, but avoid direct contact with your household contacts during this time to avoid spreading the infection to them.   If you have a separate bathroom and living quarters during the next 2 weeks away from others, that would be preferable.    If you can't completely isolate, then wear a mask, wash hands frequently with soap and water for at least 15 seconds, minimize close contact with others, and have a friend or family member check regularly from a distance to make sure you are not getting seriously worse.     You should not be going out in public, should not be going to stores, to work or other public places until all your symptoms have resolved and at least 10 days + 24 hours of no symptoms at all have transpired.   Ideally you should avoid contact with others for a full 10 days if possible.  One of the goals is to limit spread to high risk people; people that are older and elderly, people with multiple health issues like diabetes, heart disease, lung disease, and anybody that has weakened immune systems such as people with cancer or on immunosuppressive therapy.   Stacey Simon was seen today for follow-up.  Diagnoses and all orders for this visit:  COVID-19 virus infection  Cough  Nausea  Sore throat  Other orders -     albuterol (PROVENTIL) (2.5  MG/3ML) 0.083% nebulizer solution; Take 3 mLs (2.5 mg total) by nebulization every 6 (six) hours as needed for wheezing or shortness of breath. -     promethazine-dextromethorphan (PROMETHAZINE-DM) 6.25-15 MG/5ML syrup; Take 5 mLs by mouth 4 (four) times daily as needed for cough. -     Multiple Vitamins-Minerals (EMERGEN-C IMMUNE PLUS) PACK; Take 1 tablet by mouth 2 (two) times daily.  f/u pending call back

## 2020-01-05 NOTE — Progress Notes (Signed)
Patient referred.

## 2020-01-06 ENCOUNTER — Other Ambulatory Visit (HOSPITAL_COMMUNITY): Payer: Self-pay | Admitting: Nurse Practitioner

## 2020-01-06 ENCOUNTER — Encounter: Payer: Self-pay | Admitting: Nurse Practitioner

## 2020-01-06 ENCOUNTER — Telehealth (HOSPITAL_COMMUNITY): Payer: Self-pay | Admitting: Nurse Practitioner

## 2020-01-06 DIAGNOSIS — U071 COVID-19: Secondary | ICD-10-CM

## 2020-01-06 NOTE — Telephone Encounter (Signed)
Called to Discuss with patient about Covid symptoms and the use of regeneron, a monoclonal antibody infusion for those with mild to moderate Covid symptoms and at a high risk of hospitalization.     Pt is qualified for this infusion at the [facility] center due to co-morbid conditions and/or a member of an at-risk group.     Unable to reach pt. Left message to return call. Sent mychart message.   Larose Batres, DNP, AGNP-C [phone] (Infusion Center Hotline)  

## 2020-01-06 NOTE — Progress Notes (Signed)
I connected by phone with Stacey Simon on 01/06/2020 at 11:16 AM to discuss the potential use of an new treatment for mild to moderate COVID-19 viral infection in non-hospitalized patients.  This patient is a 59 y.o. female that meets the FDA criteria for Emergency Use Authorization of casirivimab\imdevimab.  Has a (+) direct SARS-CoV-2 viral test result  Has mild or moderate COVID-19   Is ? 61 years of age and weighs ? 40 kg  Is NOT hospitalized due to COVID-19  Is NOT requiring oxygen therapy or requiring an increase in baseline oxygen flow rate due to COVID-19  Is within 10 days of symptom onset  Has at least one of the high risk factor(s) for progression to severe COVID-19 and/or hospitalization as defined in EUA.  Specific high risk criteria : Diabetes, asthma  I have spoken and communicated the following to the patient or parent/caregiver:  1. FDA has authorized the emergency use of casirivimab\imdevimab for the treatment of mild to moderate COVID-19 in adults and pediatric patients with positive results of direct SARS-CoV-2 viral testing who are 76 years of age and older weighing at least 40 kg, and who are at high risk for progressing to severe COVID-19 and/or hospitalization.  2. The significant known and potential risks and benefits of casirivimab\imdevimab, and the extent to which such potential risks and benefits are unknown.  3. Information on available alternative treatments and the risks and benefits of those alternatives, including clinical trials.  4. Patients treated with casirivimab\imdevimab should continue to self-isolate and use infection control measures (e.g., wear mask, isolate, social distance, avoid sharing personal items, clean and disinfect "high touch" surfaces, and frequent handwashing) according to CDC guidelines.   5. The patient or parent/caregiver has the option to accept or refuse casirivimab\imdevimab .  After reviewing this  information with the patient, The patient agreed to proceed with receiving casirivimab\imdevimab infusion and will be provided a copy of the Fact sheet prior to receiving the infusion.Beckey Rutter, [place], AGNP-C [phone] ([place])

## 2020-01-07 ENCOUNTER — Encounter: Payer: BC Managed Care – PPO | Admitting: Medical

## 2020-01-07 ENCOUNTER — Ambulatory Visit (HOSPITAL_COMMUNITY)
Admission: RE | Admit: 2020-01-07 | Discharge: 2020-01-07 | Disposition: A | Discharge status: Home / Self Care | Payer: BC Managed Care – PPO | Source: Ambulatory Visit | Attending: Pulmonary Disease | Admitting: Pulmonary Disease

## 2020-01-07 DIAGNOSIS — U071 COVID-19: Secondary | ICD-10-CM | POA: Diagnosis not present

## 2020-01-07 MED ORDER — DIPHENHYDRAMINE HCL 50 MG/ML IJ SOLN: 50.0000 mg | Freq: Once | INTRAMUSCULAR | Status: DC | PRN

## 2020-01-07 MED ORDER — SODIUM CHLORIDE 0.9 % IV SOLN
1200.0000 mg | Freq: Once | INTRAVENOUS | Status: AC
  Administered 2020-01-07: 1200 mg via INTRAVENOUS
  Filled 2020-01-07: qty 10

## 2020-01-07 MED ORDER — FAMOTIDINE IN NACL 20-0.9 MG/50ML-% IV SOLN: 20.0000 mg | Freq: Once | INTRAVENOUS | Status: DC | PRN

## 2020-01-07 MED ORDER — SODIUM CHLORIDE 0.9 % IV SOLN: INTRAVENOUS | Status: DC | PRN

## 2020-01-07 MED ORDER — ALBUTEROL SULFATE HFA 108 (90 BASE) MCG/ACT IN AERS: 2.0000 | INHALATION_SPRAY | Freq: Once | RESPIRATORY_TRACT | Status: DC | PRN

## 2020-01-07 MED ORDER — METHYLPREDNISOLONE SODIUM SUCC 125 MG IJ SOLR: 125.0000 mg | Freq: Once | INTRAMUSCULAR | Status: DC | PRN

## 2020-01-07 MED ORDER — EPINEPHRINE 0.3 MG/0.3ML IJ SOAJ: 0.3000 mg | Freq: Once | INTRAMUSCULAR | Status: DC | PRN

## 2020-01-07 NOTE — Discharge Instructions (Signed)

## 2020-01-07 NOTE — Progress Notes (Signed)
  Diagnosis: COVID-19  Physician:Dr. Asencion Noble  Procedure: Covid Infusion Clinic Med: casirivimab\imdevimab infusion - Provided patient with casirivimab\imdevimab fact sheet for patients, parents and caregivers prior to infusion.  Complications: No immediate complications noted.  Discharge: Discharged home   Sanjuana Mae 01/07/2020

## 2020-01-17 ENCOUNTER — Other Ambulatory Visit: Payer: Self-pay | Admitting: Medical

## 2020-01-18 ENCOUNTER — Other Ambulatory Visit: Payer: Self-pay

## 2020-01-18 ENCOUNTER — Encounter: Payer: Self-pay | Admitting: Medical

## 2020-01-18 ENCOUNTER — Ambulatory Visit: Payer: BC Managed Care – PPO | Admitting: Medical

## 2020-01-18 VITALS — BP 120/72 | HR 70 | Temp 98.5°F | Wt 189.0 lb

## 2020-01-18 DIAGNOSIS — U071 COVID-19: Secondary | ICD-10-CM

## 2020-01-18 DIAGNOSIS — E118 Type 2 diabetes mellitus with unspecified complications: Secondary | ICD-10-CM | POA: Diagnosis not present

## 2020-01-18 DIAGNOSIS — R079 Chest pain, unspecified: Secondary | ICD-10-CM | POA: Insufficient documentation

## 2020-01-18 DIAGNOSIS — J4541 Moderate persistent asthma with (acute) exacerbation: Secondary | ICD-10-CM

## 2020-01-18 DIAGNOSIS — E785 Hyperlipidemia, unspecified: Secondary | ICD-10-CM

## 2020-01-18 DIAGNOSIS — B353 Tinea pedis: Secondary | ICD-10-CM | POA: Insufficient documentation

## 2020-01-18 MED ORDER — CLOTRIMAZOLE-BETAMETHASONE 1-0.05 % EX CREA: 1.0000 "application " | TOPICAL_CREAM | Freq: Two times a day (BID) | CUTANEOUS | 0 refills | Status: DC

## 2020-01-18 NOTE — Progress Notes (Signed)
Subjective:  Stacey Simon is a 59 y.o. female who presents for Chief Complaint  Patient presents with  . other    chest pain positive covid 2 weeks ago,      Here today for chest pain.  She was diagnosed with Covid 2 weeks ago.  We did a visit on 01/05/2020 virtually. She had some initial runny nose, nasal congestion, sore throat, some cough. Most of those symptom resolved, but still has some cough, some shortness of breath.  Biggest symptom lately have been body aches particularly in legs.   However, she notes a few episodes of chest pains.  She notes having chest pains that she describes as being in center of chest, was bad enough it made her get out of bed.  Lasted for 10 minutes.  Denies chest pain associated with SOB, nausea, vomiting, sweats or dizziness.  The whole time she was sick only had to use albuterol twice, but continue her regular Advair.  Her taste has been fluctuating between being off and on.   Not checking glucose recently.  No other aggravating or relieving factors.    No other c/o.  Past Medical History:  Diagnosis Date  . Allergy   . Anemia    in college  . Asthma    a few prior hospitalizations, last 1992  . Diabetes mellitus without complication ([place]) 09/3612  . Hyperlipidemia   . Insomnia   . Low HDL (under 40) 08/2015  . Microalbuminuria    Current Outpatient Medications on File Prior to Visit  Medication Sig Dispense Refill  . ADVAIR DISKUS 500-50 MCG/DOSE AEPB Inhale 1 puff into the lungs 2 (two) times daily. 60 each 11  . albuterol (PROVENTIL) (2.5 MG/3ML) 0.083% nebulizer solution Take 3 mLs (2.5 mg total) by nebulization every 6 (six) hours as needed for wheezing or shortness of breath. 75 mL 1  . albuterol (VENTOLIN HFA) 108 (90 Base) MCG/ACT inhaler Inhale 2 puffs into the lungs every 6 (six) hours as needed for wheezing or shortness of breath. 18 g 1  . glucose blood test strip Use as instructed 100 each 12  . Lancets (ONETOUCH  ULTRASOFT) lancets Use as instructed 100 each 12  . losartan (COZAAR) 25 MG tablet Take 1 tablet (25 mg total) by mouth daily. 30 tablet 0  . Multiple Vitamins-Minerals (EMERGEN-C IMMUNE PLUS) PACK Take 1 tablet by mouth 2 (two) times daily. 10 each 0  . promethazine-dextromethorphan (PROMETHAZINE-DM) 6.25-15 MG/5ML syrup Take 5 mLs by mouth 4 (four) times daily as needed for cough. 120 mL 0  . rosuvastatin (CRESTOR) 10 MG tablet TAKE 1 TABLET BY MOUTH EVERYDAY AT BEDTIME 30 tablet 0  . montelukast (SINGULAIR) 10 MG tablet Take 1 tablet (10 mg total) by mouth at bedtime. (Patient not taking: Reported on 01/05/2020) 30 tablet 3  . [DISCONTINUED] levalbuterol (XOPENEX HFA) 45 MCG/ACT inhaler Inhale 2 puffs into the lungs every 6 (six) hours as needed for wheezing. (Patient not taking: Reported on 04/25/2019) 1 Inhaler 12  . [DISCONTINUED] metFORMIN (GLUCOPHAGE) 500 MG tablet TAKE 1 TABLET BY MOUTH EVERY DAY WITH BREAKFAST (Patient not taking: Reported on 04/19/2019) 90 tablet 3   No current facility-administered medications on file prior to visit.     The following portions of the patient's history were reviewed and updated as appropriate: allergies, current medications, past family history, past medical history, past social history, past surgical history and problem list.  ROS Otherwise as in subjective above  Objective: BP 120/72  Pulse 70   Temp 98.5 F (36.9 C)   Wt 189 lb (85.7 kg)   SpO2 98%   BMI 30.51 kg/m   Wt Readings from Last 3 Encounters:  01/18/20 189 lb (85.7 kg)  01/05/20 180 lb (81.6 kg)  04/25/19 182 lb (82.6 kg)   General appearance: alert, no distress, well developed, well nourished HEENT: normocephalic, sclerae anicteric, conjunctiva pink and moist,  nares patent, no discharge or erythema, pharynx normal Oral cavity: MMM, no lesions Neck: supple, no lymphadenopathy, no thyromegaly, no masses Heart: RRR, normal S1, S2, no murmurs Lungs: CTA bilaterally, no  wheezes, rhonchi, or rales Abdomen: +bs, soft, +diathesis recti, non tender, non distended, no masses, no hepatomegaly, no splenomegaly Pulses: 2+ radial pulses, 2+ pedal pulses, normal cap refill Ext: no edema Skin, warm, dry, in general, good flaking and maceration between some of her toes bilaterally suggestive of tinea pedis  EKG Rate 61 bpm PR 127ms QRS 24ms QTC 474ms Axis -11 degrees Normal sinus rhythm No acute changes     Assessment: Encounter Diagnoses  Name Primary?  . Chest pain, unspecified type Yes  . COVID-19 virus infection   . Moderate persistent asthma with acute exacerbation   . Diabetes mellitus with complication ([place])   . Hyperlipidemia, unspecified hyperlipidemia type   . Tinea pedis of both feet      Plan: Reviewed EKG.  Her symptoms sound more consistent with just the overall body aches with having recent Covid infection.  She is improving.  She still has quite a bit of fatigue and fleeting sense of smell.  EKG was reassuring  We discussed that her body will gradually get back to normal in the next few weeks.  Advise she pace herself and resume activity gradually over the next few weeks.  Continue to hydrate well.  Advised that her sense of smell will hopefully gradually come back as well  Labs today for further evaluation and to recheck on chronic disease issues  Tinea pedis-begin Lotrisone cream for 1.5 weeks.  If not much improving then recheck  Jacari was seen today for other.  Diagnoses and all orders for this visit:  Chest pain, unspecified type -     Comprehensive metabolic panel -     CBC with Differential/Platelet -     Hemoglobin A1c -     EKG 12-Lead -     Pulse oximetry (single); Future  COVID-19 virus infection  Moderate persistent asthma with acute exacerbation  Diabetes mellitus with complication (HCC) -     Comprehensive metabolic panel -     Hemoglobin A1c  Hyperlipidemia, unspecified hyperlipidemia type -      Comprehensive metabolic panel  Tinea pedis of both feet  Other orders -     clotrimazole-betamethasone (LOTRISONE) cream; Apply 1 application topically 2 (two) times daily.    Follow up: pending labs

## 2020-01-18 NOTE — Telephone Encounter (Signed)
Patient has appointment today at 12:45 but needs refills on these 2 medications.

## 2020-01-19 LAB — COMPREHENSIVE METABOLIC PANEL
ALT: 30 IU/L (ref 0–32)
AST: 22 IU/L (ref 0–40)
Albumin/Globulin Ratio: 1.6 (ref 1.2–2.2)
Albumin: 4.4 g/dL (ref 3.8–4.9)
Alkaline Phosphatase: 88 IU/L (ref 48–121)
BUN/Creatinine Ratio: 11 — ABNORMAL LOW (ref 12–28)
BUN: 9 mg/dL (ref 8–27)
Bilirubin Total: 0.5 mg/dL (ref 0.0–1.2)
CO2: 25 mmol/L (ref 20–29)
Calcium: 9.8 mg/dL (ref 8.7–10.3)
Chloride: 104 mmol/L (ref 96–106)
Creatinine, Ser: 0.81 mg/dL (ref 0.57–1.00)
GFR calc Af Amer: 91 mL/min/{1.73_m2} (ref >59)
GFR calc non Af Amer: 79 mL/min/{1.73_m2} (ref >59)
Globulin, Total: 2.7 g/dL (ref 1.5–4.5)
Glucose: 131 mg/dL — ABNORMAL HIGH (ref 65–99)
Potassium: 4.8 mmol/L (ref 3.5–5.2)
Sodium: 140 mmol/L (ref 134–144)
Total Protein: 7.1 g/dL (ref 6.0–8.5)

## 2020-01-19 LAB — CBC WITH DIFFERENTIAL/PLATELET
Basophils Absolute: 0 10*3/uL (ref 0.0–0.2)
Basos: 1 %
EOS (ABSOLUTE): 0.1 10*3/uL (ref 0.0–0.4)
Eos: 2 %
Hematocrit: 37.1 % (ref 34.0–46.6)
Hemoglobin: 12.3 g/dL (ref 11.1–15.9)
Immature Grans (Abs): 0 10*3/uL (ref 0.0–0.1)
Immature Granulocytes: 0 %
Lymphocytes Absolute: 2.9 10*3/uL (ref 0.7–3.1)
Lymphs: 44 %
MCH: 27.2 pg (ref 26.6–33.0)
MCHC: 33.2 g/dL (ref 31.5–35.7)
MCV: 82 fL (ref 79–97)
Monocytes Absolute: 0.6 10*3/uL (ref 0.1–0.9)
Monocytes: 9 %
Neutrophils Absolute: 2.9 10*3/uL (ref 1.4–7.0)
Neutrophils: 44 %
Platelets: 364 10*3/uL (ref 150–450)
RBC: 4.52 x10E6/uL (ref 3.77–5.28)
RDW: 13.2 % (ref 11.7–15.4)
WBC: 6.5 10*3/uL (ref 3.4–10.8)

## 2020-01-19 LAB — HEMOGLOBIN A1C
Est. average glucose Bld gHb Est-mCnc: 192 mg/dL
Hgb A1c MFr Bld: 8.3 % — ABNORMAL HIGH (ref 4.8–5.6)

## 2020-01-20 ENCOUNTER — Other Ambulatory Visit: Payer: Self-pay | Admitting: Medical

## 2020-01-20 DIAGNOSIS — E118 Type 2 diabetes mellitus with unspecified complications: Secondary | ICD-10-CM

## 2020-01-20 MED ORDER — ROSUVASTATIN CALCIUM 20 MG PO TABS: 20.0000 mg | ORAL_TABLET | Freq: Every day | ORAL | 3 refills | Status: DC

## 2020-01-20 MED ORDER — LOSARTAN POTASSIUM 25 MG PO TABS: 25.0000 mg | ORAL_TABLET | Freq: Every day | ORAL | 3 refills | Status: DC

## 2020-01-20 MED ORDER — ALBUTEROL SULFATE HFA 108 (90 BASE) MCG/ACT IN AERS: 2.0000 | INHALATION_SPRAY | Freq: Four times a day (QID) | RESPIRATORY_TRACT | 1 refills | Status: DC | PRN

## 2020-01-28 ENCOUNTER — Telehealth: Payer: Self-pay

## 2020-01-28 NOTE — Telephone Encounter (Signed)
We can try Januvia if that is what she wants today.  If not Metformin the recommended treatment though would be [place] or Rybelsus which is more in line with the typical next step given the potential heart benefit  Let me know.  If she just wants to start with the Januvia that is okay

## 2020-01-28 NOTE — Telephone Encounter (Signed)
Pt. Called back for lab results, I made her aware of her lab results and your recommendations. She said she is not currently taking any diabetic medication and she has tried Metformin in the past and could not tolerate it. She said there was one medicine she wanted to ask you about but she would have to call back because she is driving right now.

## 2020-01-28 NOTE — Telephone Encounter (Signed)
Pt. Called back and said the medicine was Januvia if she could possible try that one if you thought that would be ok.

## 2020-01-31 ENCOUNTER — Other Ambulatory Visit: Payer: Self-pay | Admitting: Medical

## 2020-01-31 MED ORDER — SITAGLIPTIN PHOSPHATE 25 MG PO TABS: 25.0000 mg | ORAL_TABLET | Freq: Every day | ORAL | 1 refills | Status: DC

## 2020-01-31 NOTE — Telephone Encounter (Signed)
Januvia sent.  Let us begin this and make sure she has a way to check her blood sugars.  I believe she has a glucometer  If not we will need to call in out with strips, quantity 100 with 11 refills.  Lets follow-up in 1 to 2 months on blood sugars (or CPX if due)

## 2020-01-31 NOTE — Telephone Encounter (Signed)
Patient wants to try Januvia

## 2020-02-01 NOTE — Telephone Encounter (Signed)
Spoke to patient. She has a glucometer. She will call back to schedule.

## 2020-02-03 ENCOUNTER — Other Ambulatory Visit: Payer: Self-pay | Admitting: Medical

## 2020-02-22 ENCOUNTER — Ambulatory Visit: Payer: BC Managed Care – PPO | Attending: Internal Medicine

## 2020-02-22 DIAGNOSIS — Z23 Encounter for immunization: Secondary | ICD-10-CM

## 2020-02-22 NOTE — Progress Notes (Signed)
   Covid-19 Vaccination Clinic  Name:  Stacey Simon    MRN: [id number] DOB: 26-Jun-1960  02/22/2020  Ms. Stacey Simon was observed post Covid-19 immunization for 15 minutes without incident. She was provided with Vaccine Information Sheet and instruction to access the V-Safe system.   Ms. Stacey Simon was instructed to call 911 with any severe reactions post vaccine: Marland Kitchen Difficulty breathing  . Swelling of face and throat  . A fast heartbeat  . A bad rash all over body  . Dizziness and weakness

## 2020-04-04 ENCOUNTER — Other Ambulatory Visit: Payer: Self-pay | Admitting: Medical

## 2020-05-31 ENCOUNTER — Other Ambulatory Visit: Payer: Self-pay

## 2020-05-31 ENCOUNTER — Encounter: Payer: Self-pay | Admitting: Medical

## 2020-05-31 ENCOUNTER — Telehealth: Payer: BC Managed Care – PPO | Admitting: Medical

## 2020-05-31 VITALS — Ht 66.5 in | Wt 175.0 lb

## 2020-05-31 DIAGNOSIS — R109 Unspecified abdominal pain: Secondary | ICD-10-CM

## 2020-05-31 DIAGNOSIS — Z789 Other specified health status: Secondary | ICD-10-CM | POA: Diagnosis not present

## 2020-05-31 NOTE — Progress Notes (Signed)
Subjective:     Patient ID: Stacey Simon, female   DOB: 05/04/1961, 60 y.o.   MRN: [id number]  This visit type was conducted due to national recommendations for restrictions regarding the COVID-19 Pandemic (e.g. social distancing) in an effort to limit this patient's exposure and mitigate transmission in our community.  Due to their co-morbid illnesses, this patient is at least at moderate risk for complications without adequate follow up.  This format is felt to be most appropriate for this patient at this time.    Documentation for virtual audio and video telecommunications through [place] encounter:  The patient was located at home. The provider was located in the office. The patient did consent to this visit and is aware of possible charges through their insurance for this visit.  The other persons participating in this telemedicine service were none. Time spent on call was 20 minutes and in review of previous records 20 minutes total.  This virtual service is not related to other E/M service within previous 7 days.   HPI Chief Complaint  Patient presents with  . GI Problem    ABDOMINAL PAIN AFTER RETURN FROM [place]   Virtual consult for abdominal pain.   She and husband went to [place] for about a month.  Was eating foods that she is not used too.  Stomach started cramping real bad.  symptoms began 3 days before Christmas.  Initially saw a doctor who is friend.  Husband brother doctor gave her injection.  Christmas day day had bad cramp, went to the emergency dept in [place].   Had labs at ED visit.   She has the results but they are in [place].  Hgb 13.2, HCT 41%.     She was given one medication for 3 days and one medication for 5 days  Before the ED visit in [place], couldn't hardly use the bathroom.  Saw some blood in stool on Christmas day.    In last week no burning with urination, no urinary frequency, no urgency.   No recent fever.  No body  aches or chills.  Has some mild low back pain.    In the past week has intermittent abdominal discomfort.   She notes generalized stomach ache and cramps.  Last BM few minutes ago.   Occasional constipation, but BM typical once daily.  No mucous or greasy stools.    No chest pain, no dyspnea.  Has only used inhaler twice in recent weeks.  Using nothing for current symptoms.  No loose stool or diarrhea in last week.    No nausea.   Not currently checking glucose.  Glucometer not working  No other aggravating or relieving factors. No other complaint.   Past Medical History:  Diagnosis Date  . Allergy   . Anemia    in college  . Asthma    a few prior hospitalizations, last 1992  . Diabetes mellitus without complication ([place]) 06/8313  . Hyperlipidemia   . Insomnia   . Low HDL (under 40) 08/2015  . Microalbuminuria    Current Outpatient Medications on File Prior to Visit  Medication Sig Dispense Refill  . albuterol (PROVENTIL) (2.5 MG/3ML) 0.083% nebulizer solution Take 3 mLs (2.5 mg total) by nebulization every 6 (six) hours as needed for wheezing or shortness of breath. 75 mL 1  . albuterol (VENTOLIN HFA) 108 (90 Base) MCG/ACT inhaler Inhale 2 puffs into the lungs every 6 (six) hours as needed for wheezing or  shortness of breath. 18 g 1  . clotrimazole-betamethasone (LOTRISONE) cream Apply 1 application topically 2 (two) times daily. 30 g 0  . glucose blood test strip Use as instructed 100 each 12  . JANUVIA 25 MG tablet TAKE 1 TABLET BY MOUTH EVERY DAY 30 tablet 1  . Lancets (ONETOUCH ULTRASOFT) lancets Use as instructed 100 each 12  . losartan (COZAAR) 25 MG tablet Take 1 tablet (25 mg total) by mouth daily. 90 tablet 3  . Multiple Vitamins-Minerals (EMERGEN-C IMMUNE PLUS) PACK Take 1 tablet by mouth 2 (two) times daily. 10 each 0  . rosuvastatin (CRESTOR) 20 MG tablet Take 1 tablet (20 mg total) by mouth daily. 90 tablet 3  . ADVAIR DISKUS 500-50 MCG/DOSE AEPB Inhale 1 puff into  the lungs 2 (two) times daily. 60 each 11  . [DISCONTINUED] levalbuterol (XOPENEX HFA) 45 MCG/ACT inhaler Inhale 2 puffs into the lungs every 6 (six) hours as needed for wheezing. (Patient not taking: Reported on 04/25/2019) 1 Inhaler 12  . [DISCONTINUED] metFORMIN (GLUCOPHAGE) 500 MG tablet TAKE 1 TABLET BY MOUTH EVERY DAY WITH BREAKFAST (Patient not taking: Reported on 04/19/2019) 90 tablet 3   No current facility-administered medications on file prior to visit.     Review of Systems As in subjective    Objective:   Physical Exam Due to coronavirus pandemic stay at home measures, patient visit was virtual and they were not examined in person.   Ht 5' 6.5" (1.689 m)   Wt 175 lb (79.4 kg)   BMI 27.82 kg/m   Gen: wd, wn, nad Psych : pleasant, answers questions appropriative      Assessment:     Encounter Diagnoses  Name Primary?  . Abdominal discomfort Yes  . History of foreign travel        Plan:     dicussed symptoms and concerns.  She does not have fever or blood in the stool or diarrhea at this point.  It is more of a vague abdominal discomfort intermittent.  This is likely due to her bowels adjusting to being back here [place] the states.  I advise she use Pepto-Bismol once or twice daily for the next 5 to 7 days, begin a probiotic over-the-counter, avoid spicy foods, citrus, tomato-based foods, fried foods and keep things more bland for the time being.  Advise she needs to be checking her blood sugars.  We will call out of glucometer.  If no improvement or worse in the next 3 to 5 days then recheck in person so I can examine and potentially do some labs.  She is going to try and upload a copy of the labs that were done in [place] and send them to me by Lavada Mesi was seen today for gi problem.  Diagnoses and all orders for this visit:  Abdominal discomfort  History of foreign travel  f/u if not improving within 5-7 days

## 2020-06-01 NOTE — Progress Notes (Signed)
Script has been faxed over

## 2020-06-02 ENCOUNTER — Encounter: Payer: Self-pay | Admitting: Medical

## 2020-06-08 ENCOUNTER — Other Ambulatory Visit: Payer: Self-pay | Admitting: Medical

## 2020-06-19 ENCOUNTER — Ambulatory Visit (INDEPENDENT_AMBULATORY_CARE_PROVIDER_SITE_OTHER): Payer: BC Managed Care – PPO | Admitting: Medical

## 2020-06-19 ENCOUNTER — Other Ambulatory Visit: Payer: Self-pay

## 2020-06-19 ENCOUNTER — Encounter: Payer: Self-pay | Admitting: Medical

## 2020-06-19 VITALS — BP 136/70 | HR 72 | Ht 66.0 in | Wt 188.4 lb

## 2020-06-19 DIAGNOSIS — M542 Cervicalgia: Secondary | ICD-10-CM

## 2020-06-19 DIAGNOSIS — M62838 Other muscle spasm: Secondary | ICD-10-CM

## 2020-06-19 DIAGNOSIS — S161XXA Strain of muscle, fascia and tendon at neck level, initial encounter: Secondary | ICD-10-CM | POA: Diagnosis not present

## 2020-06-19 MED ORDER — CYCLOBENZAPRINE HCL 10 MG PO TABS: 10.0000 mg | ORAL_TABLET | Freq: Every day | ORAL | 0 refills | Status: DC

## 2020-06-19 MED ORDER — IBUPROFEN 600 MG PO TABS: 600.0000 mg | ORAL_TABLET | Freq: Three times a day (TID) | ORAL | 0 refills | Status: DC | PRN
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
## 2020-06-20 DIAGNOSIS — M62838 Other muscle spasm: Secondary | ICD-10-CM | POA: Insufficient documentation

## 2020-06-20 DIAGNOSIS — S161XXA Strain of muscle, fascia and tendon at neck level, initial encounter: Secondary | ICD-10-CM | POA: Insufficient documentation

## 2020-07-07 ENCOUNTER — Other Ambulatory Visit: Payer: Self-pay | Admitting: Medical

## 2020-07-07 DIAGNOSIS — J4540 Moderate persistent asthma, uncomplicated: Secondary | ICD-10-CM

## 2020-07-10 NOTE — Telephone Encounter (Signed)
Pt. Requesting refill on advair last pt was 06/19/20

## 2020-08-03 ENCOUNTER — Other Ambulatory Visit: Payer: Self-pay | Admitting: Medical

## 2020-08-17 ENCOUNTER — Encounter: Payer: Self-pay | Admitting: Family Medicine

## 2020-08-17 ENCOUNTER — Other Ambulatory Visit: Payer: Self-pay

## 2020-08-17 ENCOUNTER — Ambulatory Visit (INDEPENDENT_AMBULATORY_CARE_PROVIDER_SITE_OTHER): Payer: BC Managed Care – PPO | Admitting: Family Medicine

## 2020-08-17 VITALS — BP 110/70 | HR 76 | Ht 66.5 in | Wt 185.2 lb

## 2020-08-17 DIAGNOSIS — E118 Type 2 diabetes mellitus with unspecified complications: Secondary | ICD-10-CM

## 2020-08-17 DIAGNOSIS — R109 Unspecified abdominal pain: Secondary | ICD-10-CM

## 2020-08-17 LAB — POCT URINALYSIS DIP (PROADVANTAGE DEVICE)
Blood, UA: NEGATIVE
Glucose, UA: NEGATIVE mg/dL
Ketones, POC UA: NEGATIVE mg/dL
Leukocytes, UA: NEGATIVE
Nitrite, UA: NEGATIVE
Specific Gravity, Urine: 1.025
Urobilinogen, Ur: NEGATIVE
pH, UA: 5.5 (ref 5.0–8.0)
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
## 2020-08-17 NOTE — Patient Instructions (Addendum)
Use moist heat (or heating pad) to the area for 15 minutes 3-4 times/day. Do some gentle stretches after applying heat. You can take your prescription ibuprofen three times daily WITH FOOD, if needed for pain. If you run out, you can take three 200mg  tablets of over-the-counter advil/motrin/ibuprofen instead (total dose of 600mg ).  If pain interferes with sleep, you can take cyclobenzaprine (flexeril) at bedtime.  This will make you sleepy.  It is a muscle relaxant and may help with your pain--use it only if not improving with ibuprofen and heat alone, and only take at night, as it makes you sleepy.   Start using Wixela inhaler (this is the same medication as in Advair). You may also use some zyrtec or claritin or allegra once daily for allergies (sneezing, runny nose)

## 2020-08-25 ENCOUNTER — Other Ambulatory Visit: Payer: Self-pay | Admitting: Medical

## 2020-09-27 ENCOUNTER — Other Ambulatory Visit: Payer: Self-pay

## 2020-09-27 ENCOUNTER — Ambulatory Visit (INDEPENDENT_AMBULATORY_CARE_PROVIDER_SITE_OTHER): Payer: BC Managed Care – PPO | Admitting: Medical

## 2020-09-27 ENCOUNTER — Encounter: Payer: Self-pay | Admitting: Medical

## 2020-09-27 ENCOUNTER — Encounter: Payer: BC Managed Care – PPO | Admitting: Medical

## 2020-09-27 ENCOUNTER — Other Ambulatory Visit (HOSPITAL_COMMUNITY)
Admission: RE | Admit: 2020-09-27 | Discharge: 2020-09-27 | Disposition: A | Discharge status: Home / Self Care | Payer: BC Managed Care – PPO | Source: Ambulatory Visit | Attending: Medical | Admitting: Medical

## 2020-09-27 VITALS — BP 130/72 | HR 70 | Ht 67.0 in | Wt 191.6 lb

## 2020-09-27 DIAGNOSIS — E118 Type 2 diabetes mellitus with unspecified complications: Secondary | ICD-10-CM

## 2020-09-27 DIAGNOSIS — Z0000 Encounter for general adult medical examination without abnormal findings: Secondary | ICD-10-CM | POA: Insufficient documentation

## 2020-09-27 DIAGNOSIS — Z86010 Personal history of colonic polyps: Secondary | ICD-10-CM | POA: Diagnosis not present

## 2020-09-27 DIAGNOSIS — E785 Hyperlipidemia, unspecified: Secondary | ICD-10-CM

## 2020-09-27 DIAGNOSIS — Z124 Encounter for screening for malignant neoplasm of cervix: Secondary | ICD-10-CM | POA: Insufficient documentation

## 2020-09-27 DIAGNOSIS — R809 Proteinuria, unspecified: Secondary | ICD-10-CM

## 2020-09-27 DIAGNOSIS — L84 Corns and callosities: Secondary | ICD-10-CM

## 2020-09-27 DIAGNOSIS — J4540 Moderate persistent asthma, uncomplicated: Secondary | ICD-10-CM | POA: Diagnosis not present

## 2020-09-27 DIAGNOSIS — G4700 Insomnia, unspecified: Secondary | ICD-10-CM

## 2020-09-27 DIAGNOSIS — R748 Abnormal levels of other serum enzymes: Secondary | ICD-10-CM

## 2020-09-27 DIAGNOSIS — Z780 Asymptomatic menopausal state: Secondary | ICD-10-CM

## 2020-09-27 DIAGNOSIS — E2839 Other primary ovarian failure: Secondary | ICD-10-CM

## 2020-09-27 DIAGNOSIS — M21619 Bunion of unspecified foot: Secondary | ICD-10-CM

## 2020-09-27 DIAGNOSIS — R87619 Unspecified abnormal cytological findings in specimens from cervix uteri: Secondary | ICD-10-CM | POA: Insufficient documentation

## 2020-09-27 DIAGNOSIS — Z23 Encounter for immunization: Secondary | ICD-10-CM | POA: Diagnosis not present

## 2020-09-27 DIAGNOSIS — J301 Allergic rhinitis due to pollen: Secondary | ICD-10-CM

## 2020-09-27 DIAGNOSIS — Z7185 Encounter for immunization safety counseling: Secondary | ICD-10-CM

## 2020-09-27 LAB — POCT URINALYSIS DIP (PROADVANTAGE DEVICE)
Bilirubin, UA: NEGATIVE
Blood, UA: NEGATIVE
Glucose, UA: NEGATIVE mg/dL
Ketones, POC UA: NEGATIVE mg/dL
Leukocytes, UA: NEGATIVE
Nitrite, UA: NEGATIVE
Protein Ur, POC: NEGATIVE mg/dL
Specific Gravity, Urine: 1.02
Urobilinogen, Ur: 0.2
pH, UA: 5.5 (ref 5.0–8.0)

## 2020-09-27 NOTE — Progress Notes (Signed)
Subjective:   HPI  Stacey Simon is a 60 y.o. female who presents for Chief Complaint  Patient presents with  . Annual Exam    Physical with fasting labs     Patient Care Team: Saroya Riccobono, Leward Quan as PCP - General (Family Medicine) Sees dentist Sees eye doctor Dr. Dalbert Mayotte, GI    Concerns: Wanting to lose weight.  She knows she needs to better with diet.   Diabetes - 192 fasting this morning.  No new foot lesions.  Still has some issues with corn of bottom of right foot.  Asthma - lately doing ok.  Using daily prevention inhaler. Insurance wouldn't pay for Advair, so using alternate  No current gynecological issues.  No bleeding, no pain.   Reviewed their medical, surgical, family, social, medication, and allergy history and updated chart as appropriate.  Past Medical History:  Diagnosis Date  . Allergy   . Anemia    in college  . Asthma    a few prior hospitalizations, last 1992  . Diabetes mellitus without complication ([place]) 10/3873  . Hyperlipidemia   . Insomnia   . Low HDL (under 40) 08/2015  . Microalbuminuria     Family History  Problem Relation Age of Onset  . Asthma Mother   . Hypertension Mother   . Asthma Sister   . Asthma Daughter   . Asthma Son   . Asthma Brother   . Hypertension Maternal Grandmother   . Hypertension Paternal Grandmother   . Hypertension Paternal Grandfather   . Stroke Paternal Grandfather   . Cancer Maternal Aunt        breast  . Colon cancer Neg Hx      Current Outpatient Medications:  .  ADVAIR DISKUS 500-50 MCG/DOSE AEPB, TAKE 1 PUFF BY MOUTH TWICE A DAY, Disp: 60 each, Rfl: 2 .  albuterol (VENTOLIN HFA) 108 (90 Base) MCG/ACT inhaler, TAKE 2 PUFFS BY MOUTH EVERY 6 HOURS AS NEEDED FOR WHEEZE OR SHORTNESS OF BREATH, Disp: 18 each, Rfl: 1 .  clotrimazole-betamethasone (LOTRISONE) cream, Apply 1 application topically 2 (two) times daily., Disp: 30 g, Rfl: 0 .  glucose blood test strip, Use as  instructed, Disp: 100 each, Rfl: 12 .  JANUVIA 25 MG tablet, TAKE 1 TABLET BY MOUTH EVERY DAY, Disp: 30 tablet, Rfl: 1 .  Lancets (ONETOUCH ULTRASOFT) lancets, Use as instructed, Disp: 100 each, Rfl: 12 .  losartan (COZAAR) 25 MG tablet, Take 1 tablet (25 mg total) by mouth daily., Disp: 90 tablet, Rfl: 3 .  rosuvastatin (CRESTOR) 20 MG tablet, Take 1 tablet (20 mg total) by mouth daily., Disp: 90 tablet, Rfl: 3  No Known Allergies    Review of Systems Constitutional: -fever, -chills, -sweats, -unexpected weight change, -decreased appetite, -fatigue Allergy: -sneezing, -itching, -congestion Dermatology: -changing moles, --rash, -lumps ENT: -runny nose, -ear pain, -sore throat, -hoarseness, -sinus pain, -teeth pain, - ringing in ears, -hearing loss, -nosebleeds Cardiology: -chest pain, -palpitations, -swelling, -difficulty breathing when lying flat, -waking up short of breath Respiratory: -cough, -shortness of breath, -difficulty breathing with exercise or exertion, -wheezing, -coughing up blood Gastroenterology: -abdominal pain, -nausea, -vomiting, -diarrhea, -constipation, -blood in stool, -changes in bowel movement, -difficulty swallowing or eating Hematology: -bleeding, -bruising  Musculoskeletal: -joint aches, -muscle aches, -joint swelling, -back pain, -neck pain, -cramping, -changes in gait Ophthalmology: denies vision changes, eye redness, itching, discharge Urology: -burning with urination, -difficulty urinating, -blood in urine, -urinary frequency, -urgency, -incontinence Neurology: -headache, -weakness, -tingling, -numbness, -memory loss, -  falls, -dizziness Psychology: -depressed mood, -agitation, -sleep problems Breast/gyn: -breast tendnerss, -discharge, -lumps, -vaginal discharge,- irregular periods, -heavy periods     Objective:  BP 130/72   Pulse 70   Ht 5\' 7"  (1.702 m)   Wt 191 lb 9.6 oz (86.9 kg)   SpO2 96%   BMI 30.01 kg/m   General appearance: alert, no distress,  WD/WN, female Skin: no worrisome lesions HEENT: normocephalic, conjunctiva/corneas normal, sclerae anicteric, PERRLA, EOMi, nares patent, no discharge or erythema, pharynx normal Neck: supple, no lymphadenopathy, no thyromegaly, no masses, normal ROM, no bruits Chest: non tender, normal shape and expansion Heart: RRR, normal S1, S2, no murmurs Lungs: CTA bilaterally, no wheezes, rhonchi, or rales Abdomen: +bs, soft, non tender, non distended, no masses, no hepatomegaly, no splenomegaly, no bruits Back: non tender, normal ROM, no scoliosis Musculoskeletal: upper extremities non tender, no obvious deformity, normal ROM throughout, lower extremities non tender, no obvious deformity, normal ROM throughout Extremities: no edema, no cyanosis, no clubbing Pulses: 2+ symmetric, upper and lower extremities, normal cap refill Neurological: alert, oriented x 3, CN2-12 intact, strength normal upper extremities and lower extremities, sensation normal throughout, DTRs 2+ throughout, no cerebellar signs, gait normal Psychiatric: normal affect, behavior normal, pleasant  Breast: nontender, no masses or lumps, no skin changes, no nipple discharge or inversion, no axillary lymphadenopathy Gyn: Normal external genitalia without lesions, vagina with normal mucosa, cervix without lesions, no cervical motion tenderness, no abnormal vaginal discharge.  Uterus and adnexa not enlarged, nontender, no masses.  Pap performed.  Exam chaperoned by nurse. Rectal: anus normal appearing  Diabetic Foot Exam - Simple   Simple Foot Form Diabetic Foot exam was performed with the following findings: Yes 09/27/2020  2:45 PM  Visual Inspection No deformities, no ulcerations, no other skin breakdown bilaterally: Yes Sensation Testing See comments: Yes Pulse Check See comments: Yes Comments Volar right foot over 1st and 2nd MTP with callous , corn of posterior volar foot midline, somewhat tender, bunion of right great toe MTP,  no  other lesions      Assessment and Plan :   Encounter Diagnoses  Name Primary?  . Need for pneumococcal vaccination Yes  . Routine general medical examination at a health care facility   . Moderate persistent asthma, unspecified whether complicated   . Bunion of great toe   . Diabetes mellitus with complication ([place])   . History of colonic polyps   . Hyperlipidemia, unspecified hyperlipidemia type   . Microalbuminuria   . Allergic rhinitis due to pollen, unspecified seasonality   . Pre-ulcerative corn or callous   . Low serum HDL   . Vaccine counseling   . Screening for cervical cancer   . Insomnia, unspecified type   . Abnormal cervical Papanicolaou smear, unspecified abnormal pap finding   . Post-menopausal   . Estrogen deficiency     Today you had a preventative care visit or wellness visit.    Topics today may have included healthy lifestyle, diet, exercise, preventative care, vaccinations, sick and well care, proper use of emergency dept and after hours care, as well as other concerns.     Recommendations: Continue to return yearly for your annual wellness and preventative care visits.  This gives [place] a chance to discuss healthy lifestyle, exercise, vaccinations, review your chart record, and perform screenings where appropriate.  I recommend you see your eye doctor yearly for routine vision care.  I recommend you see your dentist yearly for routine dental care including hygiene visits  twice yearly.   Vaccination recommendations were reviewed You are up-to-date on tetanus vaccine.  You are up-to-date on COVID-vaccine.  You are due for repeat pneumococcal vaccine 23. You had this back in 2017 and this can be repeated every 5 years  Shingles vaccine:  I recommend you have a shingles vaccine to help prevent shingles or herpes zoster outbreak.   Please call your insurer to inquire about coverage for the Shingrix vaccine given in 2 doses.   Some insurers cover this vaccine  after age 36, some cover this after age 64.  If your insurer covers this, then call to schedule appointment to have this vaccine here.  Counseled on the pneumococcal vaccine.  Vaccine information sheet given.  Pneumococcal vaccine PPSV23 given after consent obtained.    Screening for cancer: Breast cancer screening: You should perform a self breast exam monthly.   We reviewed recommendations for regular mammograms and breast cancer screening.  Colon cancer screening:  I reviewed your colonoscopy on file that is up to date from 2020  Cervical cancer screening: We reviewed recommendations for pap smear screening.    Skin cancer screening: Check your skin regularly for new changes, growing lesions, or other lesions of concern Come in for evaluation if you have skin lesions of concern.  Lung cancer screening: If you have a greater than 30 pack year history of tobacco use, then you qualify for lung cancer screening with a chest CT scan  We currently don't have screenings for other cancers besides breast, cervical, colon, and lung cancers.  If you have a strong family history of cancer or have other cancer screening concerns, please let me know.    Bone health: Get at least 150 minutes of aerobic exercise weekly Get weight bearing exercise at least once weekly I recommend a baseline bone density test  Please call to schedule your bone density test.   [facility] [address] [place], [place] [address]     Heart health: Get at least 150 minutes of aerobic exercise weekly Limit alcohol It is important to maintain a healthy blood pressure and healthy cholesterol numbers    Separate significant issues discussed: Asthma-moderate persistent, continue current therapies including controller and rescue  Diabetes-labs today, not at goal, will likely make modifications to regimen, continue daily glucose monitoring, daily foot  checks  Bunion-no recent concerns  Callus of foot- use daily foot soaks and pumice stone to gradually sand down the callus tissue  High cholesterol-continue current medications, labs today  Continue losartan for kidney protection  Insomnia- we will consider other therapy options.  Consider sleep study   Stacey Simon was seen today for annual exam.  Diagnoses and all orders for this visit:  Need for pneumococcal vaccination  Routine general medical examination at a health care facility -     DG Bone Density; Future -     Comprehensive metabolic panel -     Microalbumin/Creatinine Ratio, Urine -     CBC -     Hemoglobin A1c -     Lipid panel -     Cytology - PAP([place])  Moderate persistent asthma, unspecified whether complicated  Bunion of great toe  Diabetes mellitus with complication (H[facility]) -     Microalbumin/Creatinine Ratio, Urine -     Hemoglobin A1c  History of colonic polyps  Hyperlipidemia, unspecified hyperlipidemia type -     Lipid panel  Microalbuminuria  Allergic rhinitis due to pollen,  unspecified seasonality  Pre-ulcerative corn or callous  Low serum HDL  Vaccine counseling  Screening for cervical cancer -     Cytology - PAP([place])  Insomnia, unspecified type  Abnormal cervical Papanicolaou smear, unspecified abnormal pap finding -     Cytology - PAP([place])  Post-menopausal -     DG Bone Density; Future  Estrogen deficiency -     DG Bone Density; Future     Follow-up pending labs, yearly for physical

## 2020-09-27 NOTE — Patient Instructions (Addendum)
   Today you had a preventative care visit or wellness visit.    Topics today may have included healthy lifestyle, diet, exercise, preventative care, vaccinations, sick and well care, proper use of emergency dept and after hours care, as well as other concerns.     Recommendations: Continue to return yearly for your annual wellness and preventative care visits.  This gives Stacey Simon a chance to discuss healthy lifestyle, exercise, vaccinations, review your chart record, and perform screenings where appropriate.  I recommend you see your eye doctor yearly for routine vision care.  I recommend you see your dentist yearly for routine dental care including hygiene visits twice yearly.   Vaccination recommendations were reviewed You are up-to-date on tetanus vaccine.  You are up-to-date on COVID-vaccine.  You are due for repeat pneumococcal vaccine 23. You had this back in 2017 and this can be repeated every 5 years  Shingles vaccine:  I recommend you have a shingles vaccine to help prevent shingles or herpes zoster outbreak.   Please call your insurer to inquire about coverage for the Shingrix vaccine given in 2 doses.   Some insurers cover this vaccine after age 74, some cover this after age 2.  If your insurer covers this, then call to schedule appointment to have this vaccine here.  Counseled on the pneumococcal vaccine.  Vaccine information sheet given.  Pneumococcal vaccine PPSV23 given after consent obtained.    Screening for cancer: Breast cancer screening: You should perform a self breast exam monthly.   We reviewed recommendations for regular mammograms and breast cancer screening.  Colon cancer screening:  I reviewed your colonoscopy on file that is up to date from 2020  Cervical cancer screening: We reviewed recommendations for pap smear screening.    Skin cancer screening: Check your skin regularly for new changes, growing lesions, or other lesions of concern Come in for  evaluation if you have skin lesions of concern.  Lung cancer screening: If you have a greater than 30 pack year history of tobacco use, then you qualify for lung cancer screening with a chest CT scan  We currently don't have screenings for other cancers besides breast, cervical, colon, and lung cancers.  If you have a strong family history of cancer or have other cancer screening concerns, please let me know.    Bone health: Get at least 150 minutes of aerobic exercise weekly Get weight bearing exercise at least once weekly I recommend a baseline bone density test  Please call to schedule your bone density test.   [facility] [address], [place], [place] [address]     Heart health: Get at least 150 minutes of aerobic exercise weekly Limit alcohol It is important to maintain a healthy blood pressure and healthy cholesterol numbers    Separate significant issues discussed: Asthma-moderate persistent, continue current therapies including controller and rescue  Diabetes-labs today, not at goal, will likely make modifications to regimen, continue daily glucose monitoring, daily foot checks  Bunion-no recent concerns  Callus of foot- use daily foot soaks and pumice stone to gradually sand down the callus tissue  High cholesterol-continue current medications, labs today  Continue losartan for kidney protection  Insomnia- we will consider other therapy options.  Consider sleep study

## 2020-09-27 NOTE — Addendum Note (Signed)
Addended by: Edgar Frisk on: 09/27/2020 04:12 PM   Modules accepted: Orders

## 2020-09-28 ENCOUNTER — Other Ambulatory Visit: Payer: Self-pay | Admitting: Medical

## 2020-09-28 LAB — MICROALBUMIN / CREATININE URINE RATIO
Creatinine, Urine: 141.5 mg/dL
Microalb/Creat Ratio: 10 mg/g creat (ref 0–29)
Microalbumin, Urine: 14.2 ug/mL

## 2020-09-28 LAB — COMPREHENSIVE METABOLIC PANEL
ALT: 16 IU/L (ref 0–32)
AST: 18 IU/L (ref 0–40)
Albumin/Globulin Ratio: 1.6 (ref 1.2–2.2)
Albumin: 4.7 g/dL (ref 3.8–4.8)
Alkaline Phosphatase: 76 IU/L (ref 44–121)
BUN/Creatinine Ratio: 12 (ref 12–28)
BUN: 11 mg/dL (ref 8–27)
Bilirubin Total: 0.5 mg/dL (ref 0.0–1.2)
CO2: 22 mmol/L (ref 20–29)
Calcium: 9.8 mg/dL (ref 8.7–10.3)
Chloride: 103 mmol/L (ref 96–106)
Creatinine, Ser: 0.9 mg/dL (ref 0.57–1.00)
Globulin, Total: 3 g/dL (ref 1.5–4.5)
Glucose: 100 mg/dL — ABNORMAL HIGH (ref 65–99)
Potassium: 4.3 mmol/L (ref 3.5–5.2)
Sodium: 143 mmol/L (ref 134–144)
Total Protein: 7.7 g/dL (ref 6.0–8.5)
eGFR: 73 mL/min/{1.73_m2} (ref >59)

## 2020-09-28 LAB — LIPID PANEL
Chol/HDL Ratio: 3.3 ratio (ref 0.0–4.4)
Cholesterol, Total: 134 mg/dL (ref 100–199)
HDL: 41 mg/dL (ref >39)
LDL Chol Calc (NIH): 71 mg/dL (ref 0–99)
Triglycerides: 123 mg/dL (ref 0–149)
VLDL Cholesterol Cal: 22 mg/dL (ref 5–40)

## 2020-09-28 LAB — CBC
Hematocrit: 37.1 % (ref 34.0–46.6)
Hemoglobin: 12.2 g/dL (ref 11.1–15.9)
MCH: 26.8 pg (ref 26.6–33.0)
MCHC: 32.9 g/dL (ref 31.5–35.7)
MCV: 81 fL (ref 79–97)
Platelets: 329 10*3/uL (ref 150–450)
RBC: 4.56 x10E6/uL (ref 3.77–5.28)
RDW: 13.2 % (ref 11.7–15.4)
WBC: 8.5 10*3/uL (ref 3.4–10.8)

## 2020-09-28 LAB — HEMOGLOBIN A1C
Est. average glucose Bld gHb Est-mCnc: 169 mg/dL
Hgb A1c MFr Bld: 7.5 % — ABNORMAL HIGH (ref 4.8–5.6)

## 2020-09-28 MED ORDER — BREO ELLIPTA 200-25 MCG/INH IN AEPB: 1.0000 | INHALATION_SPRAY | Freq: Every day | RESPIRATORY_TRACT | 11 refills | Status: DC

## 2020-09-28 MED ORDER — JANUMET 50-500 MG PO TABS: 1.0000 | ORAL_TABLET | Freq: Two times a day (BID) | ORAL | 5 refills | Status: DC

## 2020-09-28 MED ORDER — ALBUTEROL SULFATE HFA 108 (90 BASE) MCG/ACT IN AERS: INHALATION_SPRAY | RESPIRATORY_TRACT | 1 refills | Status: DC

## 2020-09-29 ENCOUNTER — Encounter: Payer: BC Managed Care – PPO | Admitting: Medical

## 2020-09-29 LAB — CYTOLOGY - PAP
Adequacy: ABSENT
Comment: NEGATIVE
Diagnosis: NEGATIVE
High risk HPV: NEGATIVE

## 2020-10-05 ENCOUNTER — Other Ambulatory Visit: Payer: Self-pay | Admitting: Medical

## 2020-11-28 LAB — HM DIABETES EYE EXAM

## 2020-11-30 ENCOUNTER — Encounter: Payer: Self-pay | Admitting: Medical

## 2021-02-07 ENCOUNTER — Telehealth (INDEPENDENT_AMBULATORY_CARE_PROVIDER_SITE_OTHER): Payer: BC Managed Care – PPO | Admitting: Family Medicine

## 2021-02-07 ENCOUNTER — Other Ambulatory Visit (INDEPENDENT_AMBULATORY_CARE_PROVIDER_SITE_OTHER): Payer: BC Managed Care – PPO

## 2021-02-07 ENCOUNTER — Other Ambulatory Visit: Payer: Self-pay

## 2021-02-07 ENCOUNTER — Encounter: Payer: Self-pay | Admitting: Family Medicine

## 2021-02-07 VITALS — Ht 67.0 in | Wt 180.0 lb

## 2021-02-07 DIAGNOSIS — J029 Acute pharyngitis, unspecified: Secondary | ICD-10-CM

## 2021-02-07 DIAGNOSIS — R0989 Other specified symptoms and signs involving the circulatory and respiratory systems: Secondary | ICD-10-CM

## 2021-02-07 DIAGNOSIS — R059 Cough, unspecified: Secondary | ICD-10-CM

## 2021-02-07 DIAGNOSIS — R509 Fever, unspecified: Secondary | ICD-10-CM | POA: Diagnosis not present

## 2021-02-07 LAB — POCT INFLUENZA A/B
Influenza A, POC: NEGATIVE
Influenza B, POC: NEGATIVE

## 2021-02-07 LAB — POC COVID19 BINAXNOW: SARS Coronavirus 2 Ag: NEGATIVE

## 2021-02-07 NOTE — Progress Notes (Signed)
   Subjective:  Documentation for virtual telephone encounter.   The patient was located at home. The provider was located in the office. The patient did consent to this visit and is aware of possible charges through their insurance for this visit.  The other persons participating in this telemedicine service were none. Time spent on call was 12 minutes and in review of previous records >15 minutes total.  This virtual service is not related to other E/M service within previous 7 days.  99441 (5-31min) 99442 (11-94min) 99443 (21-69min)   Patient ID: Stacey Simon, female    DOB: 1960-07-21, 60 y.o.   MRN: [id number]  HPI Chief Complaint  Patient presents with   Cough    VIRTUAL patient started Sat with ST, fever and coughing up discolored mucus. Did rapid and PCR Monday states both were negative. Has not taken any of her regular meds today yet.    Complains of 5 day history of headache, fever, chills, sore throat, and productive cough.   No dizziness, chest pain, palpitations, shortness of breath, N/V/D.   Taking Tylenol and Hycodan.   Negative rapid and PCR Covid test were both negative at onset of symptoms.   Has underlying diabetes and asthma.   Reviewed allergies, medications, past medical, surgical, family, and social history.    Review of Systems Pertinent positives and negatives in the history of present illness.     Objective:   Physical Exam Ht 5\' 7"  (1.702 m)   Wt 180 lb (81.6 kg)   BMI 28.19 kg/m   And oriented and in no acute distress.  Speaking in complete sentences with out difficulty.      Assessment & Plan:  Fever and chills  Chest congestion  Cough  Acute pharyngitis, unspecified etiology  She will come to the office parking lot for rapid flu, COVID and PCR COVID test.  Discussed symptomatic treatment.  Discussed that if she is positive I will prescribe the antiviral medication Paxlovid for her.  If she is negative, she  will treat her symptoms and follow-up if worsening or no improvement in 2 to 3 days.

## 2021-02-08 ENCOUNTER — Telehealth: Payer: Self-pay

## 2021-02-08 ENCOUNTER — Other Ambulatory Visit: Payer: Self-pay | Admitting: Family Medicine

## 2021-02-08 LAB — NOVEL CORONAVIRUS, NAA: SARS-CoV-2, NAA: NOT DETECTED

## 2021-02-08 LAB — SARS-COV-2, NAA 2 DAY TAT

## 2021-02-08 MED ORDER — AZITHROMYCIN 250 MG PO TABS: ORAL_TABLET | ORAL | 0 refills | Status: AC

## 2021-02-08 NOTE — Telephone Encounter (Signed)
Pt called & states she is still not any better, lot of chest congestion and has almost finished with all the cough medicine now.  She wants to know if you will send her in an antibiotic because been sick long time. Please let pt know

## 2021-02-08 NOTE — Telephone Encounter (Signed)
Pt informed

## 2021-04-02 ENCOUNTER — Other Ambulatory Visit: Payer: Self-pay

## 2021-04-02 ENCOUNTER — Ambulatory Visit (INDEPENDENT_AMBULATORY_CARE_PROVIDER_SITE_OTHER): Payer: Self-pay | Admitting: Plastic Surgery

## 2021-04-02 DIAGNOSIS — Z411 Encounter for cosmetic surgery: Secondary | ICD-10-CM

## 2021-04-02 NOTE — Progress Notes (Signed)
Referring Provider Tysinger, Camelia Eng, PA-C [address] [place],  [place] [address]   CC:  Chief Complaint  Patient presents with   cosmetic visit      Stacey Simon is an 60 y.o. female.  HPI: Patient presents to discuss left earlobe tear.  She had a previous tear in the earlobe which she has been meaning to get repaired.  Recently she had some swelling and inflammation related to an earring in when she woke up the earring had eroded through the edge of her earlobe so now she has 2 tears on the left side.  She is not bothered by the right side.  She wants to see if the left side could be repaired.  No Known Allergies  Outpatient Encounter Medications as of 04/02/2021  Medication Sig Note   acetaminophen (TYLENOL) 325 MG tablet Take 650 mg by mouth every 6 (six) hours as needed. 02/07/2021: Last dose 4am   glucose blood test strip Use as instructed    Lancets (ONETOUCH ULTRASOFT) lancets Use as instructed    albuterol (VENTOLIN HFA) 108 (90 Base) MCG/ACT inhaler TAKE 2 PUFFS BY MOUTH EVERY 6 HOURS AS NEEDED FOR WHEEZE OR SHORTNESS OF BREATH (Patient not taking: Reported on 02/07/2021)    clotrimazole-betamethasone (LOTRISONE) cream Apply 1 application topically 2 (two) times daily. (Patient not taking: Reported on 02/07/2021)    fluticasone furoate-vilanterol (BREO ELLIPTA) 200-25 MCG/INH AEPB Inhale 1 puff into the lungs daily. (Patient not taking: Reported on 02/07/2021)    losartan (COZAAR) 25 MG tablet Take 1 tablet (25 mg total) by mouth daily. (Patient not taking: Reported on 02/07/2021)    promethazine-dextromethorphan (PROMETHAZINE-DM) 6.25-15 MG/5ML syrup Take by mouth 4 (four) times daily as needed for cough. 02/07/2021: Last dose 1:30am-(was given for covid 8/21)   rosuvastatin (CRESTOR) 20 MG tablet Take 1 tablet (20 mg total) by mouth daily. (Patient not taking: Reported on 02/07/2021)    sitaGLIPtin-metformin (JANUMET) 50-500 MG tablet Take 1 tablet by mouth 2  (two) times daily with a meal. (Patient not taking: Reported on 02/07/2021)    [DISCONTINUED] levalbuterol (XOPENEX HFA) 45 MCG/ACT inhaler Inhale 2 puffs into the lungs every 6 (six) hours as needed for wheezing. (Patient not taking: Reported on 04/25/2019)    [DISCONTINUED] metFORMIN (GLUCOPHAGE) 500 MG tablet TAKE 1 TABLET BY MOUTH EVERY DAY WITH BREAKFAST (Patient not taking: Reported on 04/19/2019)    No facility-administered encounter medications on file as of 04/02/2021.     Past Medical History:  Diagnosis Date   Allergy    Anemia    in college   Asthma    a few prior hospitalizations, last 1992   Diabetes mellitus without complication ([place]) 11/3708   Hyperlipidemia    Insomnia    Low HDL (under 40) 08/2015   Microalbuminuria     Past Surgical History:  Procedure Laterality Date   CESAREAN SECTION     COLONOSCOPY  09/2015   polyps, Dr. Remo Lipps Armbruster   COLONOSCOPY  05/2016   [facility], due repeat 09/2018   CYST EXCISION     left volar foot   KNEE ARTHROSCOPY W/ MENISCAL REPAIR  62/6948   left   UMBILICAL HERNIA REPAIR      Family History  Problem Relation Age of Onset   Asthma Mother    Hypertension Mother    Asthma Sister    Asthma Daughter    Asthma Son    Asthma Brother    Hypertension Maternal Grandmother  Hypertension Paternal Grandmother    Hypertension Paternal Grandfather    Stroke Paternal Grandfather    Cancer Maternal Aunt        breast   Colon cancer Neg Hx     Social History   Social History Narrative   Married, has 2 children, retired from [employer].   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  Exercise - walk, and exercise with her students.   Has 2nd degree black belt in tae kwon do.   From [place] originally.  09/2020     Review of Systems General: Denies fevers, chills, weight loss CV: Denies chest pain, shortness of breath, palpitations  Physical  Exam Vitals with BMI 02/07/2021 09/27/2020 08/17/2020  Height 5\' 7"  5\' 7"  5' 6.5"  Weight 180 lbs 191 lbs 10 oz 185 lbs 3 oz  BMI 57.47 30 34.03  Systolic (No Data) 709 643  Diastolic (No Data) 72 70  Pulse - 70 76    General:  No acute distress,  Alert and oriented, Non-Toxic, Normal speech and affect Examination shows to splint to the left earlobe.  There is about a centimeter between them.  Assessment/Plan Patient presents with 2 splits of the left earlobe.  We discussed repair.  We discussed risks include bleeding, infection, damage to surrounding structures need for additional procedures.  I discussed the location and orientation of the scar.  I discussed the need to wait about 2 months to repierce the ear.  She is fully understanding and wants to move forward.  Cindra Presume 04/02/2021, 12:45 PM

## 2021-05-03 ENCOUNTER — Other Ambulatory Visit: Payer: Self-pay

## 2021-05-03 ENCOUNTER — Encounter: Payer: Self-pay | Admitting: Plastic Surgery

## 2021-05-03 ENCOUNTER — Ambulatory Visit (INDEPENDENT_AMBULATORY_CARE_PROVIDER_SITE_OTHER): Payer: Self-pay | Admitting: Plastic Surgery

## 2021-05-03 ENCOUNTER — Other Ambulatory Visit (INDEPENDENT_AMBULATORY_CARE_PROVIDER_SITE_OTHER): Payer: BC Managed Care – PPO | Admitting: Plastic Surgery

## 2021-05-03 VITALS — BP 170/74 | HR 67 | Ht 66.5 in | Wt 178.5 lb

## 2021-05-03 DIAGNOSIS — Z411 Encounter for cosmetic surgery: Secondary | ICD-10-CM

## 2021-05-03 DIAGNOSIS — M79672 Pain in left foot: Secondary | ICD-10-CM

## 2021-05-03 NOTE — Progress Notes (Signed)
Patient has had pain on the bottom of her left foot after podiatry procedure.  She has been unhappy with her previous podiatry group and wants a different opinion from a different foot and ankle specialist.  We will be happy to provide that referral for her.

## 2021-05-03 NOTE — Addendum Note (Signed)
Addended by: Cindra Presume on: 05/03/2021 12:49 PM   Modules accepted: Level of Service

## 2021-05-03 NOTE — Progress Notes (Signed)
Operative Note   DATE OF OPERATION: 05/03/2021  LOCATION:    SURGICAL DEPARTMENT: Plastic Surgery  PREOPERATIVE DIAGNOSES: Left earlobe tear x2  POSTOPERATIVE DIAGNOSES:  same  PROCEDURE:  Left earlobe repair x2  SURGEON: Talmadge Coventry, MD  ANESTHESIA:  Local  COMPLICATIONS: None.   INDICATIONS FOR PROCEDURE:  The patient, Stacey Simon is a 60 y.o. female born on 07/05/1960, is here for treatment of left earlobe tear x2 MRN: [id number]  CONSENT:  Informed consent was obtained directly from the patient. Risks, benefits and alternatives were fully discussed. Specific risks including but not limited to bleeding, infection, hematoma, seroma, scarring, pain, infection, wound healing problems, and need for further surgery were all discussed. The patient did have an ample opportunity to have questions answered to satisfaction.   DESCRIPTION OF PROCEDURE:  Local anesthesia was administered. The patient's operative site was prepped and draped in a sterile fashion. A time out was performed and all information was confirmed to be correct.  The tears were excised with an 11 blade.  Hemostasis was obtained.  Repairs were then performed with a combination of mattress and interrupted 5-0 Monocryl sutures.  The patient tolerated the procedure well.  There were no complications.

## 2021-05-07 ENCOUNTER — Other Ambulatory Visit: Payer: Self-pay | Admitting: Medical

## 2021-05-15 NOTE — Progress Notes (Signed)
Patient is a 61 year old female with PMH of multiple left earlobe tears s/p repair performed 05/03/2021 by Dr. Claudia Desanctis.  Reviewed procedural note, repair was closed with combination of mattress and interrupted 5-0 Monocryl sutures.    Today, patient was without any complaints.  She is pleased with the outcome.  Physical exam is entirely reassuring.  Sutures removed without complication or difficulty.  She understands that she will need to wait at least three months before re-piercing the left ear.  She plans to call the clinic to schedule that appointment for piercing.  Picture(s) obtained of the patient and placed in the chart were with the patient's or guardian's permission.

## 2021-05-17 ENCOUNTER — Ambulatory Visit (INDEPENDENT_AMBULATORY_CARE_PROVIDER_SITE_OTHER): Payer: Self-pay | Admitting: Physician Assistant

## 2021-05-17 ENCOUNTER — Other Ambulatory Visit: Payer: Self-pay

## 2021-05-17 DIAGNOSIS — Z411 Encounter for cosmetic surgery: Secondary | ICD-10-CM

## 2021-05-30 ENCOUNTER — Ambulatory Visit: Payer: BC Managed Care – PPO | Admitting: Podiatry

## 2021-05-30 ENCOUNTER — Other Ambulatory Visit: Payer: Self-pay | Admitting: Medical

## 2021-06-02 IMAGING — MG DIGITAL SCREENING BILAT W/ CAD
5 series · 5 of 5 positions shown · non-contrast
Comparison: Previous exam(s).

CLINICAL DATA: Screening.

EXAM:
DIGITAL SCREENING BILATERAL MAMMOGRAM WITH CAD

[L CC]
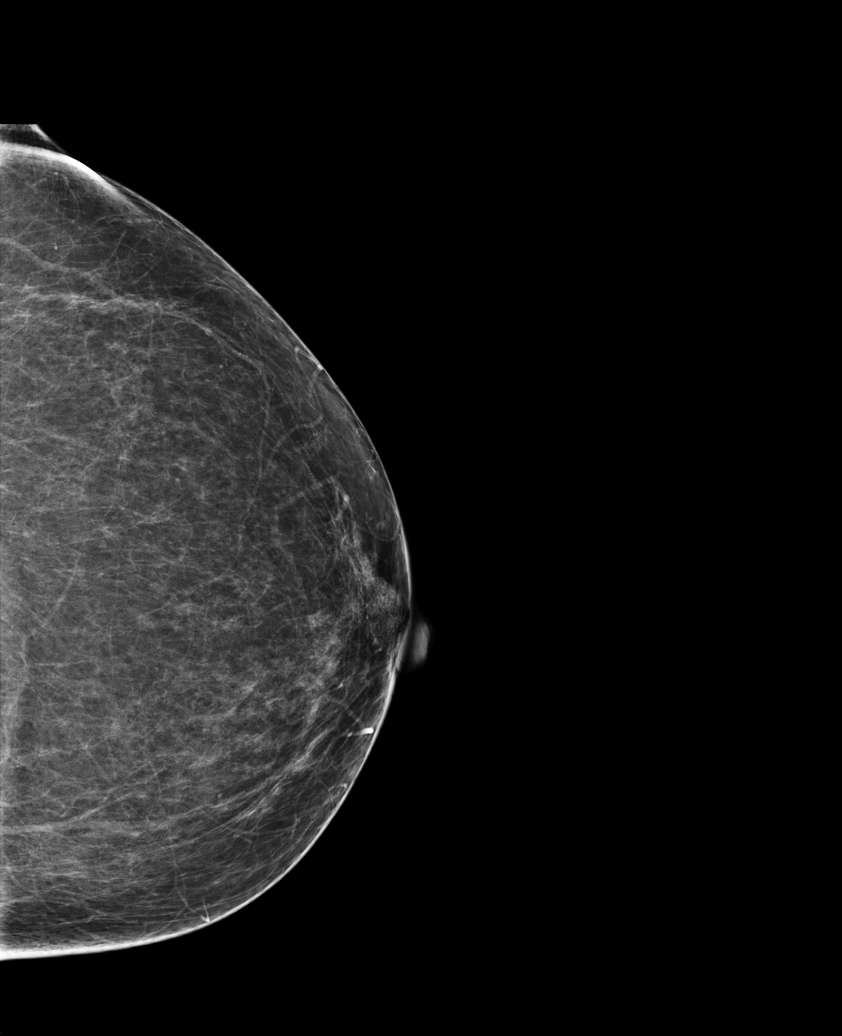

[R CC (1 of 2)]
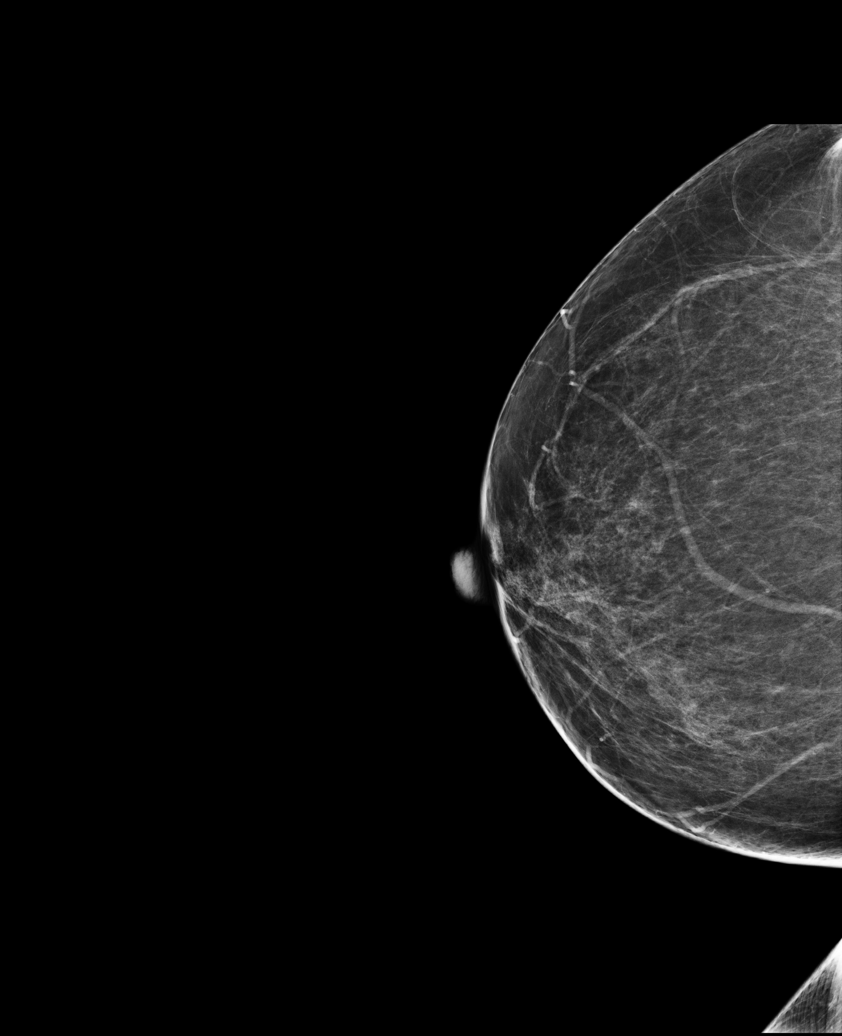

[R CC (2 of 2)]
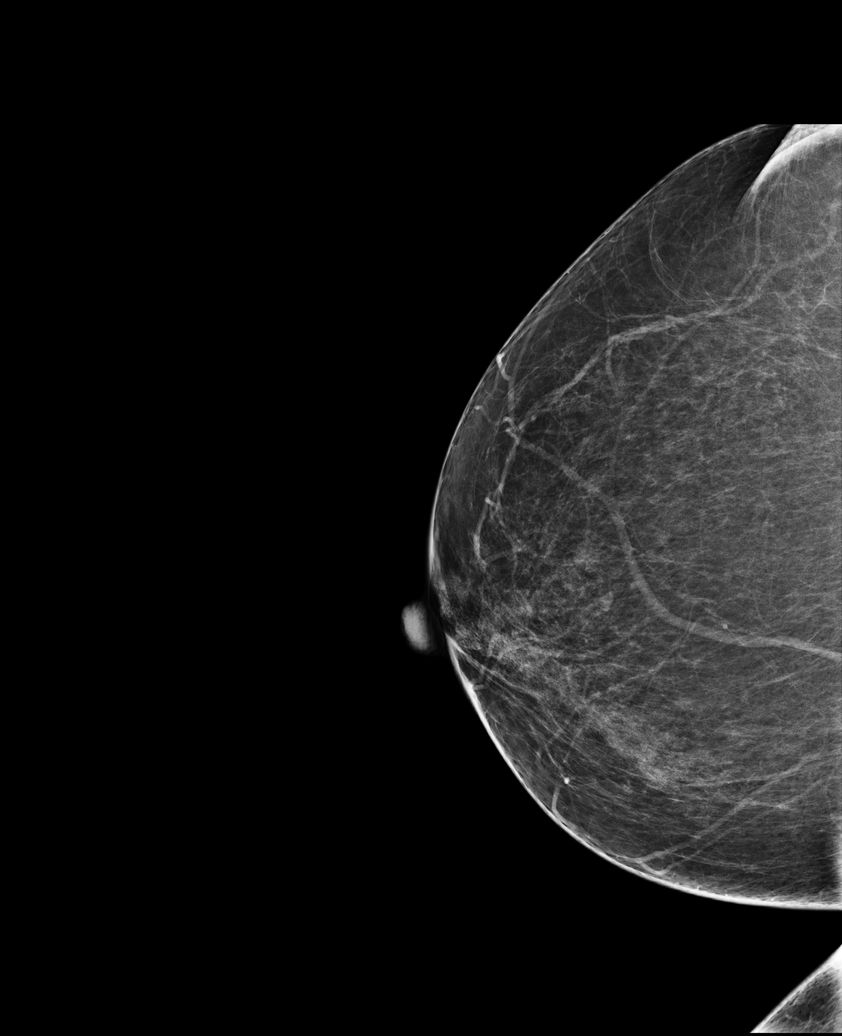

[L MLO]
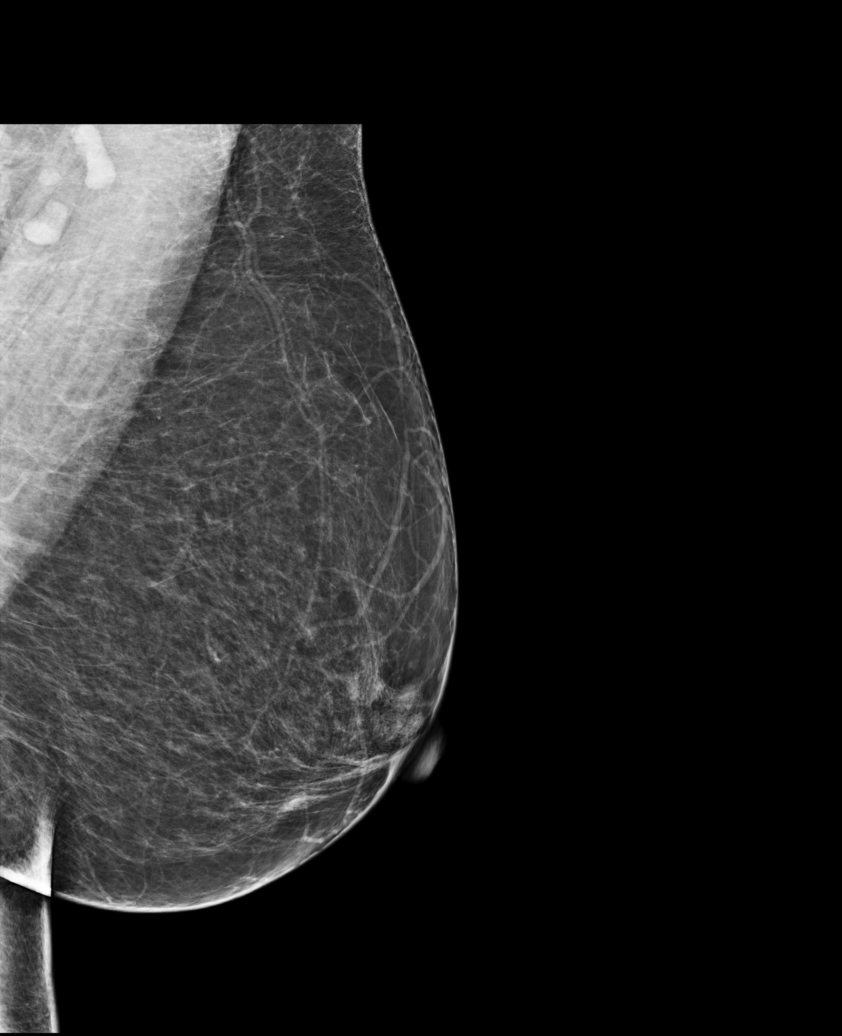

[R MLO]
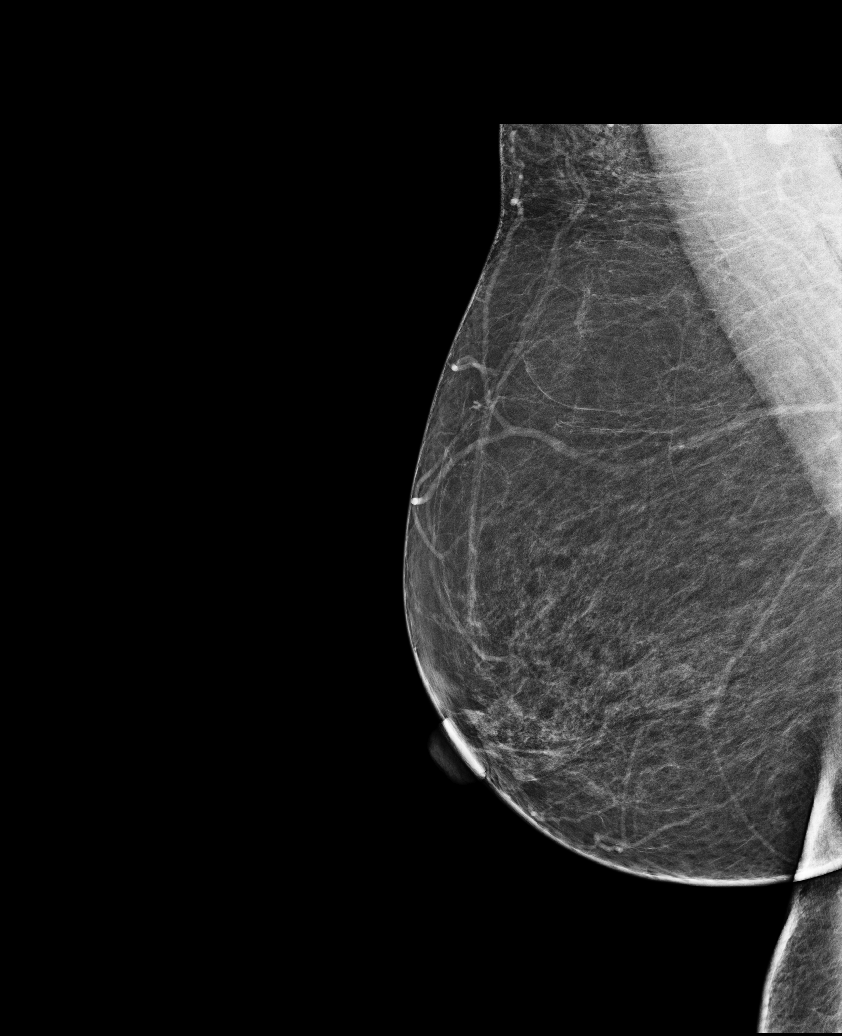

[5 of 5 positions shown; findings below may reference images not displayed]

ACR Breast Density Category b: There are scattered areas of
fibroglandular density.
FINDINGS: There are no findings suspicious for malignancy. Images were
processed with CAD.
IMPRESSION: No mammographic evidence of malignancy. A result letter of this
screening mammogram will be mailed directly to the patient.

RECOMMENDATION:
Screening mammogram in one year. (Code:AS-G-LCT)

BI-RADS CATEGORY  1: Negative.

## 2021-06-06 ENCOUNTER — Ambulatory Visit (INDEPENDENT_AMBULATORY_CARE_PROVIDER_SITE_OTHER): Payer: BC Managed Care – PPO

## 2021-06-06 ENCOUNTER — Ambulatory Visit: Payer: BC Managed Care – PPO | Admitting: Podiatry

## 2021-06-06 ENCOUNTER — Other Ambulatory Visit: Payer: Self-pay

## 2021-06-06 DIAGNOSIS — L989 Disorder of the skin and subcutaneous tissue, unspecified: Secondary | ICD-10-CM | POA: Diagnosis not present

## 2021-06-06 DIAGNOSIS — M79672 Pain in left foot: Secondary | ICD-10-CM

## 2021-06-06 DIAGNOSIS — M778 Other enthesopathies, not elsewhere classified: Secondary | ICD-10-CM | POA: Diagnosis not present

## 2021-06-06 NOTE — Patient Instructions (Signed)
Corn callus remover

## 2021-06-07 ENCOUNTER — Other Ambulatory Visit: Payer: Self-pay | Admitting: Podiatry

## 2021-06-07 ENCOUNTER — Other Ambulatory Visit (INDEPENDENT_AMBULATORY_CARE_PROVIDER_SITE_OTHER): Payer: BC Managed Care – PPO

## 2021-06-07 DIAGNOSIS — Z23 Encounter for immunization: Secondary | ICD-10-CM

## 2021-06-07 DIAGNOSIS — M778 Other enthesopathies, not elsewhere classified: Secondary | ICD-10-CM

## 2021-06-07 NOTE — Addendum Note (Signed)
Addended by: Elyse Jarvis on: 06/07/2021 03:57 PM   Modules accepted: Orders

## 2021-06-12 NOTE — Progress Notes (Signed)
° °  Subjective: 61 y.o. female presenting to the office today as a new patient for evaluation of pain to the bottom of the left foot this been going on for few years.  Intermittent pain.  She says that she had surgery 6-7 years ago and she cannot really walk on the foot.  She says she walks on the side of the foot.  She has been soaking her foot and wearing cushions with no significant improvement.  She presents for further treatment and evaluation   Past Medical History:  Diagnosis Date   Allergy    Anemia    in college   Asthma    a few prior hospitalizations, last 1992   Diabetes mellitus without complication ([place]) 12/6759   Hyperlipidemia    Insomnia    Low HDL (under 40) 08/2015   Microalbuminuria      Objective:  Physical Exam General: Alert and oriented x3 in no acute distress  Dermatology: Hyperkeratotic lesion(s) present on the plantar aspect of the left foot. Pain on palpation with a central nucleated core noted. Skin is warm, dry and supple bilateral lower extremities. Negative for open lesions or macerations.  Vascular: Palpable pedal pulses bilaterally. No edema or erythema noted. Capillary refill within normal limits.  Neurological: Epicritic and protective threshold grossly intact bilaterally.   Musculoskeletal Exam: Pain on palpation at the keratotic lesion(s) noted. Range of motion within normal limits bilateral. Muscle strength 5/5 in all groups bilateral.  Radiographic exam: Normal osseous mineralization.  Joint spaces preserved.  Otherwise normal exam.  No findings that would contribute to the patient's symptoms  Assessment: 1.  IPK/porokeratosis left foot   Plan of Care:  1. Patient evaluated 2. Excisional debridement of keratoic lesion(s) using a chisel blade was performed without incident.  3. Dressed area with light dressing.  Recommend OTC corn callus remover x4 weeks daily 4. Patient is to return to the clinic in 1 month for reevaluation  Edrick Kins, DPM [facility]  Dr. Edrick Kins, DPM    [address], [place] [address]                Office [phone]  Fax [phone]

## 2021-06-15 ENCOUNTER — Other Ambulatory Visit: Payer: Self-pay

## 2021-06-15 ENCOUNTER — Ambulatory Visit
Admission: EM | Admit: 2021-06-15 | Discharge: 2021-06-15 | Disposition: A | Discharge status: Home / Self Care | Payer: BC Managed Care – PPO | Attending: Physician Assistant | Admitting: Physician Assistant

## 2021-06-15 DIAGNOSIS — H1033 Unspecified acute conjunctivitis, bilateral: Secondary | ICD-10-CM | POA: Diagnosis not present

## 2021-06-15 MED ORDER — POLYMYXIN B-TRIMETHOPRIM 10000-0.1 UNIT/ML-% OP SOLN: 1.0000 [drp] | OPHTHALMIC | 0 refills | Status: DC

## 2021-06-15 MED ORDER — POLYMYXIN B-TRIMETHOPRIM 10000-0.1 UNIT/ML-% OP SOLN
1.0000 [drp] | OPHTHALMIC | 0 refills | Status: AC
Start: 2021-06-15 — End: 2021-06-22

## 2021-06-15 NOTE — ED Triage Notes (Signed)
Pt c/o eye redness starting with the right one "a few days ago" then yesterday started the left one. Today has had moderate green drainage.

## 2021-06-15 NOTE — ED Provider Notes (Signed)
[facility]    CSN: [id number] Arrival date & time: 06/15/21  1925      History   Chief Complaint Chief Complaint  Patient presents with   eye redness    HPI Stacey Simon is a 61 y.o. female.   Patient here today for evaluation of bilateral eye redness and drainage that started a few days ago. She reports initially she had redness in one eye but then it spread to the other eye. She does not report other symptoms.  The history is provided by the patient.   Past Medical History:  Diagnosis Date   Allergy    Anemia    in college   Asthma    a few prior hospitalizations, last 1992   Diabetes mellitus without complication ([place]) 01/6788   Hyperlipidemia    Insomnia    Low HDL (under 40) 08/2015   Microalbuminuria     Patient Active Problem List   Diagnosis Date Noted   Need for pneumococcal vaccination 09/27/2020   Abnormal cervical Papanicolaou smear 09/27/2020   Post-menopausal 09/27/2020   Estrogen deficiency 09/27/2020   Callus of foot 09/27/2020   COVID-19 virus infection 01/05/2020   Routine general medical examination at a health care facility 09/16/2018   Screening for cervical cancer 09/16/2018   Vaccine counseling 09/16/2018   Decreased hearing of both ears 09/16/2018   Microalbuminuria 10/27/2017   Hyperlipidemia 05/26/2017   Need for influenza vaccination 02/19/2017   Diabetes mellitus with complication ([place]) 38/02/1750   Low serum HDL 11/27/2015   History of colonic polyps 11/27/2015   Bunion of great toe 11/27/2015   Pre-ulcerative corn or callous 11/27/2015   Asthma, moderate persistent 06/28/2015   Rhinitis, allergic 06/28/2015   Insomnia 06/28/2015   Influenza vaccination declined 06/28/2015    Past Surgical History:  Procedure Laterality Date   CESAREAN SECTION     COLONOSCOPY  09/2015   polyps, Dr. Remo Lipps Armbruster   COLONOSCOPY  05/2016   [facility], due repeat 09/2018   CYST EXCISION     left volar foot    KNEE ARTHROSCOPY W/ MENISCAL REPAIR  06/5850   left   UMBILICAL HERNIA REPAIR      OB History   No obstetric history on file.      Home Medications    Prior to Admission medications   Medication Sig Start Date End Date Taking? Authorizing Provider  acetaminophen (TYLENOL) 325 MG tablet Take 650 mg by mouth every 6 (six) hours as needed.    [provider]  albuterol (VENTOLIN HFA) 108 (90 Base) MCG/ACT inhaler TAKE 2 PUFFS BY MOUTH EVERY 6 HOURS AS NEEDED FOR WHEEZE OR SHORTNESS OF BREATH 05/30/21   Tysinger, Camelia Eng, PA-C  clotrimazole-betamethasone (LOTRISONE) cream Apply 1 application topically 2 (two) times daily. 01/18/20   Tysinger, Camelia Eng, PA-C  fluticasone furoate-vilanterol (BREO ELLIPTA) 200-25 MCG/INH AEPB Inhale 1 puff into the lungs daily. 09/28/20   Tysinger, Camelia Eng, PA-C  glucose blood test strip Use as instructed 10/02/18   Tysinger, Camelia Eng, PA-C  Lancets [facility] ULTRASOFT) lancets Use as instructed 10/02/18   Tysinger, Camelia Eng, PA-C  losartan (COZAAR) 25 MG tablet TAKE ONE TABLET BY MOUTH DAILY 05/08/21   Tysinger, Camelia Eng, PA-C  promethazine-dextromethorphan (PROMETHAZINE-DM) 6.25-15 MG/5ML syrup Take by mouth 4 (four) times daily as needed for cough.    [provider]  rosuvastatin (CRESTOR) 20 MG tablet Take 1 tablet (20 mg total) by mouth daily. Patient not taking:  Reported on 02/07/2021 01/20/20 01/19/21  Tysinger, Camelia Eng, PA-C  sitaGLIPtin-metformin (JANUMET) 50-500 MG tablet Take 1 tablet by mouth 2 (two) times daily with a meal. 09/28/20   Tysinger, Camelia Eng, PA-C  trimethoprim-polymyxin b (POLYTRIM) ophthalmic solution Place 1 drop into both eyes every 4 (four) hours for 7 days. 06/15/21 06/22/21  Francene Finders, PA-C  levalbuterol [facility] HFA) 45 MCG/ACT inhaler Inhale 2 puffs into the lungs every 6 (six) hours as needed for wheezing. Patient not taking: Reported on 04/25/2019 09/25/18 04/25/19  Denita Lung, MD  metFORMIN (GLUCOPHAGE) 500 MG  tablet TAKE 1 TABLET BY MOUTH EVERY DAY WITH BREAKFAST Patient not taking: Reported on 04/19/2019 09/17/18 04/25/19  Tysinger, Camelia Eng, PA-C    Family History Family History  Problem Relation Age of Onset   Asthma Mother    Hypertension Mother    Asthma Sister    Asthma Daughter    Asthma Son    Asthma Brother    Hypertension Maternal Grandmother    Hypertension Paternal Grandmother    Hypertension Paternal Grandfather    Stroke Paternal Grandfather    Cancer Maternal Aunt        breast   Colon cancer Neg Hx     Social History Social History   Tobacco Use   Smoking status: Never   Smokeless tobacco: Never  Vaping Use   Vaping Use: Never used  Substance Use Topics   Alcohol use: No    Alcohol/week: 0.0 standard drinks   Drug use: No     Allergies   Patient has no known allergies.   Review of Systems Review of Systems  Constitutional:  Negative for chills and fever.  Eyes:  Positive for discharge and redness.  Respiratory:  Negative for cough and shortness of breath.   Gastrointestinal:  Negative for abdominal pain, nausea and vomiting.    Physical Exam Triage Vital Signs ED Triage Vitals [06/15/21 1936]  Enc Vitals Group     BP (!) 170/73     Pulse Rate 77     Resp 18     Temp 98.3 F (36.8 C)     Temp Source Oral     SpO2 98 %     Weight      Height      Head Circumference      Peak Flow      Pain Score 0     Pain Loc      Pain Edu?      Excl. in [place]?    No data found.  Updated Vital Signs BP (!) 170/73 (BP Location: Left Arm)    Pulse 77    Temp 98.3 F (36.8 C) (Oral)    Resp 18    SpO2 98%      Physical Exam Vitals and nursing note reviewed.  Constitutional:      General: She is not in acute distress.    Appearance: Normal appearance. She is not ill-appearing.  HENT:     Head: Normocephalic and atraumatic.  Eyes:     General:        Right eye: Discharge present.        Left eye: Discharge (purulent discharge) present.     Extraocular Movements: Extraocular movements intact.     Pupils: Pupils are equal, round, and reactive to light.     Comments: Bilateral conjunctival injection  Cardiovascular:     Rate and Rhythm: Normal rate.  Pulmonary:     Effort: Pulmonary  effort is normal.  Neurological:     Mental Status: She is alert.  Psychiatric:        Mood and Affect: Mood normal.        Behavior: Behavior normal.        Thought Content: Thought content normal.     UC Treatments / Results  Labs (all labs ordered are listed, but only abnormal results are displayed) Labs Reviewed - No data to display  EKG   Radiology No results found.  Procedures Procedures (including critical care time)  Medications Ordered in UC Medications - No data to display  Initial Impression / Assessment and Plan / UC Course  I have reviewed the triage vital signs and the nursing notes.  Pertinent labs & imaging results that were available during my care of the patient were reviewed by me and considered in my medical decision making (see chart for details).    Antibiotic drop prescribed for conjunctivitis. Recommended follow up with any further concerns or if symptoms fail to clear.   Final Clinical Impressions(s) / UC Diagnoses   Final diagnoses:  Acute bacterial conjunctivitis of both eyes   Discharge Instructions   None    ED Prescriptions     Medication Sig Dispense Auth. Provider   trimethoprim-polymyxin b (POLYTRIM) ophthalmic solution  (Status: Discontinued) Place 1 drop into both eyes every 4 (four) hours for 7 days. 10 mL Francene Finders, PA-C   trimethoprim-polymyxin b (POLYTRIM) ophthalmic solution Place 1 drop into both eyes every 4 (four) hours for 7 days. 10 mL Francene Finders, PA-C      PDMP not reviewed this encounter.   Francene Finders, PA-C 06/15/21 1949

## 2021-06-18 ENCOUNTER — Telehealth: Payer: Self-pay | Admitting: Emergency Medicine

## 2021-06-18 NOTE — Telephone Encounter (Signed)
Patient called wanting to know when could she return to work after being diagnosed with pink eye.  Per Hailey, patient can return back today.  Patient voices understanding.

## 2021-06-28 ENCOUNTER — Telehealth: Payer: Self-pay

## 2021-06-28 NOTE — Telephone Encounter (Signed)
Pt called asking who she could see regarding no longer hearing good.  I advised [facility] t# [phone]

## 2021-07-09 ENCOUNTER — Ambulatory Visit (INDEPENDENT_AMBULATORY_CARE_PROVIDER_SITE_OTHER): Payer: BC Managed Care – PPO | Admitting: Podiatry

## 2021-07-09 ENCOUNTER — Other Ambulatory Visit: Payer: Self-pay

## 2021-07-09 DIAGNOSIS — L989 Disorder of the skin and subcutaneous tissue, unspecified: Secondary | ICD-10-CM

## 2021-07-12 ENCOUNTER — Other Ambulatory Visit: Payer: Self-pay

## 2021-07-12 ENCOUNTER — Ambulatory Visit (INDEPENDENT_AMBULATORY_CARE_PROVIDER_SITE_OTHER): Payer: Self-pay | Admitting: Plastic Surgery

## 2021-07-12 DIAGNOSIS — Z411 Encounter for cosmetic surgery: Secondary | ICD-10-CM

## 2021-07-12 NOTE — Progress Notes (Signed)
Patient presents in follow-up after split earlobe repair on the left.  She is interested in the piercing.  We discussed risks and benefits and she is interested in moving forward.  2 locations were planned using a marking pen to correspond to her right sided piercings.  Piercing gun was utilized without issue after prepping with alcohol pad.  She tolerated this fine.  We will plan to see her again on an as-needed basis.  Instructions were given for management of the new piercings. ?

## 2021-07-17 NOTE — Progress Notes (Signed)
° °  Subjective: 61 y.o. female presenting to the office today for follow-up evaluation of porokeratosis to the left foot.  Patient states that overall she is doing much better.  She is able to apply more pressure on the foot when walking.  She presents for follow-up treatment and evaluation  Past Medical History:  Diagnosis Date   Allergy    Anemia    in college   Asthma    a few prior hospitalizations, last 1992   Diabetes mellitus without complication ([place]) 09/7901   Hyperlipidemia    Insomnia    Low HDL (under 40) 08/2015   Microalbuminuria      Objective:  Physical Exam General: Alert and oriented x3 in no acute distress  Dermatology: Although improved there continues to be some residual hyperkeratotic lesion(s) present on the plantar aspect of the left foot. Pain on palpation with a central nucleated core noted. Skin is warm, dry and supple bilateral lower extremities. Negative for open lesions or macerations.  Vascular: Palpable pedal pulses bilaterally. No edema or erythema noted. Capillary refill within normal limits.  Neurological: Epicritic and protective threshold grossly intact bilaterally.   Musculoskeletal Exam: Pain on palpation at the keratotic lesion(s) noted. Range of motion within normal limits bilateral. Muscle strength 5/5 in all groups bilateral.  Assessment: 1.  IPK/porokeratosis left foot   Plan of Care:  1. Patient evaluated 2. Excisional debridement of keratoic lesion(s) using a chisel blade was performed without incident as a courtesy for the patient 3. Dressed area with light dressing.  Continue OTC corn callus remover daily as needed  4. Patient is to return to the clinic as needed  Edrick Kins, DPM [facility]  Dr. Edrick Kins, DPM    [address],  [address]                Office [phone]  Fax [phone]

## 2021-07-18 ENCOUNTER — Other Ambulatory Visit: Payer: Self-pay

## 2021-07-18 ENCOUNTER — Ambulatory Visit: Payer: BC Managed Care – PPO | Admitting: Medical

## 2021-07-18 VITALS — BP 110/70 | HR 71

## 2021-07-18 DIAGNOSIS — H6123 Impacted cerumen, bilateral: Secondary | ICD-10-CM | POA: Diagnosis not present

## 2021-07-18 DIAGNOSIS — H9193 Unspecified hearing loss, bilateral: Secondary | ICD-10-CM

## 2021-07-18 NOTE — Progress Notes (Signed)
Subjective: ? Stacey Simon is a 61 y.o. female who presents for ?Chief Complaint  ?Patient presents with  ? ears clogged  ?  Ears clogged, went to ENT but was told to wait 3 months for blockage.   ?   ?Here for ears clogged.  Left ear feels clogged.  Saw ENT a few months ago, had hearing screen.   Advised hearing aid right ear, but the cost was prohibitive $6800.  She also had thick wax at that time.  She notes it was advised to give a few months to see if the wax resolved on its own.   She notes no improvement .  She also wants another ENT opinion on the hearing loss.  No other aggravating or relieving factors.   ? ?No other c/o. ? ?The following portions of the patient's history were reviewed and updated as appropriate: allergies, current medications, past family history, past medical history, past social history, past surgical history and problem list. ? ?ROS ?Otherwise as in subjective above ? ? ? ?Objective: ?BP 110/70   Pulse 71  ? ?General appearance: alert, no distress, well developed, well nourished ?HEENT: normocephalic, sclerae anicteric, conjunctiva pink and moist, impacted cerumen left ear canal, moderate cerumen right canal, nares patent, no discharge or erythema, pharynx normal ? ? ? ?Assessment: ?Encounter Diagnoses  ?Name Primary?  ? Decreased hearing of both ears Yes  ? Impacted cerumen of both ears   ? ? ? ?Plan: ?Discussed findings.  Discussed risk/benefits of procedure and patient agrees to procedure.  Successfully used warm water lavage to remove impacted cerumen from left ear canal, moderate cerumen right ear canal. Patient tolerated procedure well. Advised they avoid using any cotton swabs or other devices to clean the ear canals.  Use basic hygiene as discussed.  Follow up prn.  ? ?Referral to ENT for second opinion ? ? ?Casee was seen today for ears clogged. ? ?Diagnoses and all orders for this visit: ? ?Decreased hearing of both ears ?-     Ambulatory referral to  ENT ? ?Impacted cerumen of both ears ?-     Ambulatory referral to ENT ? ? ?Follow up: referral to ENT ? ?

## 2021-09-20 ENCOUNTER — Other Ambulatory Visit: Payer: Self-pay | Admitting: Medical

## 2021-09-20 NOTE — Telephone Encounter (Signed)
Pt called answering service and left message that she needed refill on albuterol inhaler,  I called pt back and she states she is wheezing and has no refills,  I advised am sending in refill and to call office tomorrow if needs to be seen for appt and we would get her in.

## 2021-10-17 ENCOUNTER — Other Ambulatory Visit: Payer: Self-pay | Admitting: Family Medicine

## 2021-10-17 NOTE — Telephone Encounter (Signed)
[facility] is requesting to fill pt albuterol inhaler. Pt was supposed to come see you but never did. Please advise SJ of your decision. [place]

## 2021-10-19 ENCOUNTER — Other Ambulatory Visit: Payer: Self-pay | Admitting: Medical

## 2021-10-22 ENCOUNTER — Telehealth: Payer: Self-pay | Admitting: Medical

## 2021-10-22 NOTE — Telephone Encounter (Signed)
Referral Followup °

## 2021-11-10 ENCOUNTER — Other Ambulatory Visit: Payer: Self-pay | Admitting: Medical

## 2021-11-10 ENCOUNTER — Other Ambulatory Visit: Payer: Self-pay | Admitting: Family Medicine

## 2021-11-25 ENCOUNTER — Other Ambulatory Visit: Payer: Self-pay | Admitting: Medical

## 2021-11-30 ENCOUNTER — Other Ambulatory Visit: Payer: Self-pay | Admitting: Medical

## 2021-12-14 ENCOUNTER — Other Ambulatory Visit: Payer: Self-pay | Admitting: Medical

## 2021-12-14 NOTE — Telephone Encounter (Signed)
Pt has scheduled an appointment on august 30th for her cpe

## 2021-12-27 ENCOUNTER — Other Ambulatory Visit: Payer: Self-pay | Admitting: Medical

## 2021-12-28 NOTE — Telephone Encounter (Signed)
Pharmacy is requesting 90 day supply of Rosuvastatin. Pt has CPE with you on 8/30

## 2022-01-03 ENCOUNTER — Other Ambulatory Visit: Payer: Self-pay | Admitting: Medical

## 2022-01-03 NOTE — Telephone Encounter (Signed)
Has upcoming appointment

## 2022-01-09 ENCOUNTER — Encounter: Payer: Self-pay | Admitting: Medical

## 2022-01-09 ENCOUNTER — Ambulatory Visit: Payer: BC Managed Care – PPO | Admitting: Medical

## 2022-01-09 VITALS — BP 132/78 | HR 72 | Temp 98.4°F | Ht 65.75 in | Wt 189.6 lb

## 2022-01-09 DIAGNOSIS — J4540 Moderate persistent asthma, uncomplicated: Secondary | ICD-10-CM

## 2022-01-09 DIAGNOSIS — Z23 Encounter for immunization: Secondary | ICD-10-CM

## 2022-01-09 DIAGNOSIS — Z1231 Encounter for screening mammogram for malignant neoplasm of breast: Secondary | ICD-10-CM | POA: Insufficient documentation

## 2022-01-09 DIAGNOSIS — Z1211 Encounter for screening for malignant neoplasm of colon: Secondary | ICD-10-CM | POA: Diagnosis not present

## 2022-01-09 DIAGNOSIS — R809 Proteinuria, unspecified: Secondary | ICD-10-CM | POA: Diagnosis not present

## 2022-01-09 DIAGNOSIS — E785 Hyperlipidemia, unspecified: Secondary | ICD-10-CM

## 2022-01-09 DIAGNOSIS — L602 Onychogryphosis: Secondary | ICD-10-CM | POA: Diagnosis not present

## 2022-01-09 DIAGNOSIS — J301 Allergic rhinitis due to pollen: Secondary | ICD-10-CM | POA: Diagnosis not present

## 2022-01-09 DIAGNOSIS — Z780 Asymptomatic menopausal state: Secondary | ICD-10-CM

## 2022-01-09 DIAGNOSIS — Z86010 Personal history of colonic polyps: Secondary | ICD-10-CM

## 2022-01-09 DIAGNOSIS — Z7185 Encounter for immunization safety counseling: Secondary | ICD-10-CM

## 2022-01-09 DIAGNOSIS — G4700 Insomnia, unspecified: Secondary | ICD-10-CM

## 2022-01-09 DIAGNOSIS — E2839 Other primary ovarian failure: Secondary | ICD-10-CM

## 2022-01-09 DIAGNOSIS — E118 Type 2 diabetes mellitus with unspecified complications: Secondary | ICD-10-CM

## 2022-01-09 DIAGNOSIS — Z0000 Encounter for general adult medical examination without abnormal findings: Secondary | ICD-10-CM | POA: Diagnosis not present

## 2022-01-09 DIAGNOSIS — H9193 Unspecified hearing loss, bilateral: Secondary | ICD-10-CM

## 2022-01-09 NOTE — Patient Instructions (Signed)
Please call to schedule your mammogram.   [facility]  [phone] [address] [place], [place] [address]   Shingles vaccine:  I recommend you have a shingles vaccine to help prevent shingles or herpes zoster outbreak.   Please call your insurer to inquire about coverage for the Shingrix vaccine given in 2 doses.   Some insurers cover this vaccine after age 63, some cover this after age 55.  If your insurer covers this, then call to schedule appointment to have this vaccine here.

## 2022-01-09 NOTE — Progress Notes (Unsigned)
Subjective:   HPI  Stacey Simon is a 61 y.o. female who presents for Chief Complaint  Patient presents with   Annual Exam    CPE no other issues     Patient Care Team: Rafeal Skibicki, Leward Quan as PCP - General (Family Medicine) Sees dentist Sees eye doctor Dr. Saundra Shelling, GI Dr. Mingo Amber, plastic surgery Dr. Daylene Katayama, podiatry   Concerns: Doing well overall  Diabetes-she was getting some nausea with Janumet.  states she decided to stop this.  She is not currently checking sugars  Sometimes feels something move in upper abdomen.  Wonders about hernia.  The pain is worse with a reading  Hyperlipidemia-compliant with Crestor  Microalbuminuria-compliant with losartan  Asthma-doing fine with Breo, no recent issues   Reviewed their medical, surgical, family, social, medication, and allergy history and updated chart as appropriate.  Past Medical History:  Diagnosis Date   Allergy    Anemia    in college   Asthma    a few prior hospitalizations, last 1992   Diabetes mellitus without complication ([place]) 11/4942   Hyperlipidemia    Insomnia    Low HDL (under 40) 08/2015   Microalbuminuria     Family History  Problem Relation Age of Onset   Asthma Mother    Hypertension Mother    Asthma Sister    Asthma Daughter    Asthma Son    Asthma Brother    Hypertension Maternal Grandmother    Hypertension Paternal Grandmother    Hypertension Paternal Grandfather    Stroke Paternal Grandfather    Cancer Maternal Aunt        breast   Colon cancer Neg Hx      Current Outpatient Medications:    albuterol (VENTOLIN HFA) 108 (90 Base) MCG/ACT inhaler, TAKE 2 PUFFS BY MOUTH EVERY 6 HOURS AS NEEDED FOR WHEEZE OR SHORTNESS OF BREATH, Disp: 8.5 each, Rfl: 0   BREO ELLIPTA 200-25 MCG/ACT AEPB, INHALE 1 PUFF BY MOUTH EVERY DAY, Disp: 60 each, Rfl: 0   glucose blood test strip, Use as instructed, Disp: 100 each, Rfl: 12   Lancets (ONETOUCH ULTRASOFT)  lancets, Use as instructed, Disp: 100 each, Rfl: 12   losartan (COZAAR) 25 MG tablet, TAKE ONE TABLET BY MOUTH DAILY, Disp: 90 tablet, Rfl: 0   rosuvastatin (CRESTOR) 20 MG tablet, TAKE 1 TABLET BY MOUTH EVERY DAY IN THE EVENING, Disp: 90 tablet, Rfl: 0   sitaGLIPtin-metformin (JANUMET) 50-500 MG tablet, Take 1 tablet by mouth 2 (two) times daily with a meal. (Patient not taking: Reported on 01/09/2022), Disp: 180 tablet, Rfl: 5  No Known Allergies    Review of Systems Constitutional: -fever, -chills, -sweats, -unexpected weight change, -decreased appetite, -fatigue Allergy: -sneezing, -itching, -congestion Dermatology: -changing moles, --rash, -lumps ENT: -runny nose, -ear pain, -sore throat, -hoarseness, -sinus pain, -teeth pain, - ringing in ears, -hearing loss, -nosebleeds Cardiology: -chest pain, -palpitations, -swelling, -difficulty breathing when lying flat, -waking up short of breath Respiratory: -cough, -shortness of breath, -difficulty breathing with exercise or exertion, -wheezing, -coughing up blood Gastroenterology: +abdominal pain, +nausea, -vomiting, -diarrhea, -constipation, -blood in stool, -changes in bowel movement, -difficulty swallowing or eating Hematology: -bleeding, -bruising  Musculoskeletal: -joint aches, -muscle aches, -joint swelling, -back pain, -neck pain, -cramping, -changes in gait Ophthalmology: denies vision changes, eye redness, itching, discharge Urology: -burning with urination, -difficulty urinating, -blood in urine, -urinary frequency, -urgency, -incontinence Neurology: -headache, -weakness, -tingling, -numbness, -memory loss, -falls, -dizziness Psychology: -depressed mood, -agitation, -sleep  problems Breast/gyn: -breast tendnerss, -discharge, -lumps, -vaginal discharge,- irregular periods, -heavy periods      01/09/2022    3:56 PM 07/18/2021    2:27 PM 09/27/2020    2:04 PM 09/16/2018    9:25 AM  Depression screen PHQ 2/9  Decreased Interest 0 0 0 0   Down, Depressed, Hopeless 0 0 0 0  PHQ - 2 Score 0 0 0 0       Objective:  BP 132/78   Pulse 72   Temp 98.4 F (36.9 C)   Ht 5' 5.75" (1.67 m)   Wt 189 lb 9.6 oz (86 kg)   BMI 30.84 kg/m   General appearance: alert, no distress, WD/WN, African American female Skin: unremarkable HEENT: normocephalic, conjunctiva/corneas normal, sclerae anicteric, PERRLA, EOMi, nares patent, no discharge or erythema, pharynx normal Oral cavity: MMM, tongue normal, teeth normal Neck: supple, no lymphadenopathy, no thyromegaly, no masses, normal ROM, no bruits Chest: non tender, normal shape and expansion Heart: RRR, normal S1, S2, no murmurs Lungs: CTA bilaterally, no wheezes, rhonchi, or rales Abdomen: +bs, soft, non tender, non distended, no masses, no hepatomegaly, no splenomegaly, no bruits Back: non tender, normal ROM, no scoliosis Musculoskeletal: upper extremities non tender, no obvious deformity, normal ROM throughout, lower extremities non tender, no obvious deformity, normal ROM throughout Extremities: no edema, no cyanosis, no clubbing Pulses: 2+ symmetric, upper and lower extremities, normal cap refill Neurological: alert, oriented x 3, CN2-12 intact, strength normal upper extremities and lower extremities, sensation normal throughout, DTRs 2+ throughout, no cerebellar signs, gait normal Psychiatric: normal affect, behavior normal, pleasant  Breast/gyn/rectal - deferred   Diabetic Foot Exam - Simple   Simple Foot Form Diabetic Foot exam was performed with the following findings: Yes 01/09/2022  4:44 PM  Visual Inspection See comments: Yes Sensation Testing Intact to touch and monofilament testing bilaterally: Yes Pulse Check Posterior Tibialis and Dorsalis pulse intact bilaterally: Yes Comments Hypertrophic toenails particularly great toenails, thickened corn on left volar foot, flat foot, otherwise unremarkable       Assessment and Plan :   Encounter Diagnoses  Name  Primary?   Routine general medical examination at a health care facility Yes   Allergic rhinitis due to pollen, unspecified seasonality    Post-menopausal    Microalbuminuria    Insomnia, unspecified type    Hyperlipidemia, unspecified hyperlipidemia type    Moderate persistent asthma, unspecified whether complicated    Decreased hearing of both ears    Diabetes mellitus with complication ([place])    Estrogen deficiency    History of colonic polyps    Vaccine counseling    Encounter for screening mammogram for malignant neoplasm of breast    Screen for colon cancer    Hypertrophy of nail      This visit was a preventative care visit, also known as wellness visit or routine physical.   Topics typically include healthy lifestyle, diet, exercise, preventative care, vaccinations, sick and well care, proper use of emergency dept and after hours care, as well as other concerns.     Recommendations: Continue to return yearly for your annual wellness and preventative care visits.  This gives [place] a chance to discuss healthy lifestyle, exercise, vaccinations, review your chart record, and perform screenings where appropriate.  I recommend you see your eye doctor yearly for routine vision care.  I recommend you see your dentist yearly for routine dental care including hygiene visits twice yearly.   Vaccination recommendations were reviewed Immunization History  Administered Date(s) Administered   Influenza,inj,Quad PF,6+ Mos 07/31/2015, 05/02/2016, 02/19/2017, 03/27/2018, 06/07/2021, 01/09/2022   PFIZER(Purple Top)SARS-COV-2 Vaccination 06/25/2019, 07/18/2019, 02/22/2020   Pfizer Covid-19 Vaccine Bivalent Booster 34yr & up 06/07/2021   Pneumococcal Polysaccharide-23 07/31/2015, 09/27/2020   Tdap 07/31/2015    Counseled on the influenza virus vaccine.  Vaccine information sheet given.  Influenza vaccine given after consent obtained.  Shingles vaccine:  I recommend you have a shingles  vaccine to help prevent shingles or herpes zoster outbreak.   Please call your insurer to inquire about coverage for the Shingrix vaccine given in 2 doses.   Some insurers cover this vaccine after age 63 some cover this after age 636  If your insurer covers this, then call to schedule appointment to have this vaccine here.    Screening for cancer: Colon cancer screening: I reviewed your colonoscopy on file that is up to date from 11/2018  Breast cancer screening:  due, and she plans to get this soon You should perform a self breast exam monthly.   We reviewed recommendations for regular mammograms and breast cancer screening.  Cervical cancer screening: We reviewed recommendations for pap smear screening.   Skin cancer screening: Check your skin regularly for new changes, growing lesions, or other lesions of concern Come in for evaluation if you have skin lesions of concern.  Lung cancer screening: If you have a greater than 20 pack year history of tobacco use, then you may qualify for lung cancer screening with a chest CT scan.   Please call your insurance company to inquire about coverage for this test.  We currently don't have screenings for other cancers besides breast, cervical, colon, and lung cancers.  If you have a strong family history of cancer or have other cancer screening concerns, please let me know.    Bone health: Get at least 150 minutes of aerobic exercise weekly Get weight bearing exercise at least once weekly Bone density test:  A bone density test is an imaging test that uses a type of X-ray to measure the amount of calcium and other minerals in your bones. The test may be used to diagnose or screen you for a condition that causes weak or thin bones (osteoporosis), predict your risk for a broken bone (fracture), or determine how well your osteoporosis treatment is working. The bone density test is recommended for females 642and older, or females or males <<82if  certain risk factors such as thyroid disease, long term use of steroids such as for asthma or rheumatological issues, vitamin D deficiency, estrogen deficiency, family history of osteoporosis, self or family history of fragility fracture in first degree relative.    Heart health: Get at least 150 minutes of aerobic exercise weekly Limit alcohol It is important to maintain a healthy blood pressure and healthy cholesterol numbers  Heart disease screening: Screening for heart disease includes screening for blood pressure, fasting lipids, glucose/diabetes screening, BMI height to weight ratio, reviewed of smoking status, physical activity, and diet.    Goals include blood pressure 120/80 or less, maintaining a healthy lipid/cholesterol profile, preventing diabetes or keeping diabetes numbers under good control, not smoking or using tobacco products, exercising most days per week or at least 150 minutes per week of exercise, and eating healthy variety of fruits and vegetables, healthy oils, and avoiding unhealthy food choices like fried food, fast food, high sugar and high cholesterol foods.    Other tests may possibly include EKG test, CT coronary calcium score, echocardiogram,  exercise treadmill stress test.    Medical care options: I recommend you continue to seek care here first for routine care.  We try really hard to have available appointments Monday through Friday daytime hours for sick visits, acute visits, and physicals.  Urgent care should be used for after hours and weekends for significant issues that cannot wait till the next day.  The emergency department should be used for significant potentially life-threatening emergencies.  The emergency department is expensive, can often have long wait times for less significant concerns, so try to utilize primary care, urgent care, or telemedicine when possible to avoid unnecessary trips to the emergency department.  Virtual visits and telemedicine  have been introduced since the pandemic started in 2020, and can be convenient ways to receive medical care.  We offer virtual appointments as well to assist you in a variety of options to seek medical care.   Advanced Directives: I recommend you consider completing a [place] and Living Will.   These documents respect your wishes and help alleviate burdens on your loved ones if you were to become terminally ill or be in a position to need those documents enforced.    You can complete Advanced Directives yourself, have them notarized, then have copies made for our office, for you and for anybody you feel should have them in safe keeping.  Or, you can have an attorney prepare these documents.   If you haven't updated your Last Will and Testament in a while, it may be worthwhile having an attorney prepare these documents together and save on some costs.       Separate significant issues discussed: Diabetes-not compliant with Janumet.  Labs today, consider other options since she notes nausea really bad with Janumet.  Discussed routine follow-up, yearly eye exam, daily foot checks  Hyperlipidemia-labs today, continue statin  Microalbumin-continue losartan, labs today  Asthma-continue Breo prevention inhaler, continue albuterol as needed  Hypertrophic nails-advised to go ahead and follow-up with podiatry for nail care  Abdominal discomfort-no obvious hernia or mass.  This is likely related to constipation.  Counseled on prevention of constipation, trial of MiraLAX, good water and fiber intake    Analleli was seen today for annual exam.  Diagnoses and all orders for this visit:  Routine general medical examination at a health care facility -     Comprehensive metabolic panel -     CBC -     Lipid panel -     Hemoglobin A1c -     Microalbumin/Creatinine Ratio, Urine -     MM DIGITAL SCREENING BILATERAL; Future -     Flu Vaccine QUAD 6+ mos PF IM (Fluarix Quad  PF)  Allergic rhinitis due to pollen, unspecified seasonality  Post-menopausal  Microalbuminuria  Insomnia, unspecified type  Hyperlipidemia, unspecified hyperlipidemia type -     Lipid panel  Moderate persistent asthma, unspecified whether complicated  Decreased hearing of both ears  Diabetes mellitus with complication ([facility]) -     Hemoglobin A1c -     Microalbumin/Creatinine Ratio, Urine  Estrogen deficiency  History of colonic polyps -     Ambulatory referral to Gastroenterology  Vaccine counseling  Encounter for screening mammogram for malignant neoplasm of breast -     MM DIGITAL SCREENING BILATERAL; Future  Screen for colon cancer -     Ambulatory referral to Gastroenterology  Hypertrophy of nail   Follow-up pending labs, yearly for physical

## 2022-01-10 ENCOUNTER — Other Ambulatory Visit: Payer: Self-pay | Admitting: Medical

## 2022-01-10 DIAGNOSIS — L602 Onychogryphosis: Secondary | ICD-10-CM | POA: Insufficient documentation

## 2022-01-11 LAB — COMPREHENSIVE METABOLIC PANEL
ALT: 16 IU/L (ref 0–32)
AST: 18 IU/L (ref 0–40)
Albumin/Globulin Ratio: 1.5 (ref 1.2–2.2)
Albumin: 4.7 g/dL (ref 3.9–4.9)
Alkaline Phosphatase: 93 IU/L (ref 44–121)
BUN/Creatinine Ratio: 13 (ref 12–28)
BUN: 12 mg/dL (ref 8–27)
Bilirubin Total: 0.4 mg/dL (ref 0.0–1.2)
CO2: 25 mmol/L (ref 20–29)
Calcium: 9.8 mg/dL (ref 8.7–10.3)
Chloride: 105 mmol/L (ref 96–106)
Creatinine, Ser: 0.91 mg/dL (ref 0.57–1.00)
Globulin, Total: 3.1 g/dL (ref 1.5–4.5)
Glucose: 125 mg/dL — ABNORMAL HIGH (ref 70–99)
Potassium: 4.4 mmol/L (ref 3.5–5.2)
Sodium: 143 mmol/L (ref 134–144)
Total Protein: 7.8 g/dL (ref 6.0–8.5)
eGFR: 71 mL/min/{1.73_m2} (ref >59)

## 2022-01-11 LAB — CBC
Hematocrit: 38.3 % (ref 34.0–46.6)
Hemoglobin: 12.5 g/dL (ref 11.1–15.9)
MCH: 27.1 pg (ref 26.6–33.0)
MCHC: 32.6 g/dL (ref 31.5–35.7)
MCV: 83 fL (ref 79–97)
Platelets: 322 10*3/uL (ref 150–450)
RBC: 4.61 x10E6/uL (ref 3.77–5.28)
RDW: 13.1 % (ref 11.7–15.4)
WBC: 7.1 10*3/uL (ref 3.4–10.8)

## 2022-01-11 LAB — LIPID PANEL
Chol/HDL Ratio: 4.9 ratio — ABNORMAL HIGH (ref 0.0–4.4)
Cholesterol, Total: 202 mg/dL — ABNORMAL HIGH (ref 100–199)
HDL: 41 mg/dL (ref >39)
LDL Chol Calc (NIH): 124 mg/dL — ABNORMAL HIGH (ref 0–99)
Triglycerides: 210 mg/dL — ABNORMAL HIGH (ref 0–149)
VLDL Cholesterol Cal: 37 mg/dL (ref 5–40)

## 2022-01-11 LAB — MICROALBUMIN / CREATININE URINE RATIO
Creatinine, Urine: 114.5 mg/dL
Microalb/Creat Ratio: 28 mg/g creat (ref 0–29)
Microalbumin, Urine: 31.9 ug/mL

## 2022-01-11 LAB — HEMOGLOBIN A1C
Est. average glucose Bld gHb Est-mCnc: 180 mg/dL
Hgb A1c MFr Bld: 7.9 % — ABNORMAL HIGH (ref 4.8–5.6)

## 2022-01-16 MED ORDER — ROSUVASTATIN CALCIUM 20 MG PO TABS: 20.0000 mg | ORAL_TABLET | Freq: Every day | ORAL | 1 refills | Status: DC

## 2022-01-17 ENCOUNTER — Other Ambulatory Visit: Payer: Self-pay | Admitting: Medical

## 2022-01-17 MED ORDER — TRULICITY 0.75 MG/0.5ML ~~LOC~~ SOAJ: 0.7500 mg | SUBCUTANEOUS | 1 refills | Status: DC

## 2022-01-17 MED ORDER — FLUTICASONE FUROATE-VILANTEROL 200-25 MCG/ACT IN AEPB
INHALATION_SPRAY | RESPIRATORY_TRACT | 5 refills | Status: DC
Start: 2022-01-17 — End: 2022-06-26

## 2022-01-17 MED ORDER — ALBUTEROL SULFATE HFA 108 (90 BASE) MCG/ACT IN AERS: 2.0000 | INHALATION_SPRAY | Freq: Four times a day (QID) | RESPIRATORY_TRACT | 2 refills | Status: DC | PRN

## 2022-01-17 MED ORDER — LOSARTAN POTASSIUM 25 MG PO TABS: 25.0000 mg | ORAL_TABLET | Freq: Every day | ORAL | 3 refills | Status: DC

## 2022-02-05 ENCOUNTER — Telehealth: Payer: Self-pay | Admitting: Medical

## 2022-02-05 NOTE — Telephone Encounter (Signed)
Spoke to patient and she can't come in today as she has too much going on but she will see JCL tomorrow afternoon. Advised patient to make sure she is well hydrated and if she gets worse let us know.

## 2022-02-05 NOTE — Telephone Encounter (Signed)
Pt called wanted to have appt today, offered tomorrow monrng @ 9:00 She states she felt light headed and nurse at work checked bp and it was 160/77   She is concerned and wanted to speak with nurse-  did not make appt

## 2022-02-06 ENCOUNTER — Ambulatory Visit (INDEPENDENT_AMBULATORY_CARE_PROVIDER_SITE_OTHER): Payer: BC Managed Care – PPO | Admitting: Family Medicine

## 2022-02-06 ENCOUNTER — Encounter: Payer: Self-pay | Admitting: Family Medicine

## 2022-02-06 VITALS — BP 130/64 | HR 70 | Temp 96.8°F | Wt 184.9 lb

## 2022-02-06 DIAGNOSIS — I10 Essential (primary) hypertension: Secondary | ICD-10-CM

## 2022-02-06 DIAGNOSIS — I951 Orthostatic hypotension: Secondary | ICD-10-CM | POA: Diagnosis not present

## 2022-02-06 NOTE — Progress Notes (Signed)
   Subjective:    Patient ID: Stacey Simon, female    DOB: [birth year], 61 y.o.   MRN: [id number]  HPI She has a history of hypertension and presently is on Cozaar.  She has had a couple of occasions where she became lightheaded when she moved from 1 position to another.  The dizziness did go away relatively quickly.  She then mention 1 episode of her stomach distress.   Review of Systems     Objective:   Physical Exam Alert and in no distress.  Cardiac exam shows regular rhythm without murmurs or gallops.  Lungs are clear to auscultation.       Assessment & Plan:  Hypertension, unspecified type  Postural hypotension I explained that the symptoms are of moving quicker than her body could respond.  We will definitely monitor her blood pressure.  She does have a cuff and will measure it against ours to make sure that she is getting appropriate readings and to make sure that her pressure is not too low.  I then discussed the 1 episode of abdominal distress and stated that there is not enough information to really make any comments about that. Her blood pressure cuff is accurate.

## 2022-03-13 ENCOUNTER — Other Ambulatory Visit: Payer: Self-pay | Admitting: Medical

## 2022-03-13 MED ORDER — ALBUTEROL SULFATE HFA 108 (90 BASE) MCG/ACT IN AERS: 2.0000 | INHALATION_SPRAY | Freq: Four times a day (QID) | RESPIRATORY_TRACT | 2 refills | Status: DC | PRN

## 2022-03-14 ENCOUNTER — Telehealth: Payer: Self-pay | Admitting: Medical

## 2022-03-14 NOTE — Telephone Encounter (Signed)
Pt wants to know if she has a balance with [place].

## 2022-03-15 ENCOUNTER — Other Ambulatory Visit: Payer: Self-pay | Admitting: Medical

## 2022-03-19 NOTE — Telephone Encounter (Signed)
Discussed with Melissa there is a 10 balance and a 10 credit = zero balance. Called pt reached voice mail, lm re: zero balance

## 2022-05-11 ENCOUNTER — Other Ambulatory Visit: Payer: Self-pay | Admitting: Medical

## 2022-06-12 ENCOUNTER — Encounter: Payer: Self-pay | Admitting: Medical

## 2022-06-12 ENCOUNTER — Telehealth: Payer: BC Managed Care – PPO | Admitting: Medical

## 2022-06-12 VITALS — Wt 185.0 lb

## 2022-06-12 DIAGNOSIS — R051 Acute cough: Secondary | ICD-10-CM

## 2022-06-12 DIAGNOSIS — U071 COVID-19: Secondary | ICD-10-CM | POA: Diagnosis not present

## 2022-06-12 MED ORDER — EMERGEN-C IMMUNE PLUS PO PACK: 1.0000 | PACK | Freq: Two times a day (BID) | ORAL | 0 refills | Status: DC

## 2022-06-12 MED ORDER — NIRMATRELVIR/RITONAVIR (PAXLOVID)TABLET: 3.0000 | ORAL_TABLET | Freq: Two times a day (BID) | ORAL | 0 refills | Status: AC

## 2022-06-12 NOTE — Progress Notes (Signed)
Subjective:     Patient ID: Stacey Simon, female   DOB: 10/24/1960, 62 y.o.   MRN: [id number]  This visit type was conducted due to national recommendations for restrictions regarding the COVID-19 Pandemic (e.g. social distancing) in an effort to limit this patient's exposure and mitigate transmission in our community.  Due to their co-morbid illnesses, this patient is at least at moderate risk for complications without adequate follow up.  This format is felt to be most appropriate for this patient at this time.    Documentation for virtual audio and video telecommunications through [facility] encounter:  The patient was located at home. The provider was located in the office. The patient did consent to this visit and is aware of possible charges through their insurance for this visit.  The other persons participating in this telemedicine service were none. Time spent on call was 20 minutes and in review of previous records 20 minutes total.  This virtual service is not related to other E/M service within previous 7 days.   HPI Chief Complaint  Patient presents with   Covid Positive    Positive for covid- symptoms- runny nose,cough, body aches, started monday   Virtual for illness.  Started 2 days ago with symptoms.  Early in the morning on Tuesday tested + for covid on home test.   Has runny nose , cough, body aches.  No fever.  Cough is mild currently.   She has headaches.  No NVD.  No SOB or wheezing.  Using tylenol for headache.  Has had covid twice before and did ok with the illness.  No other aggravating or relieving factors. No other complaint.   Past Medical History:  Diagnosis Date   Allergy    Anemia    in college   Asthma    a few prior hospitalizations, last 1992   Diabetes mellitus without complication ([facility] [place]) 05/173   Hyperlipidemia    Insomnia    Low HDL (under 40) 08/2015   Microalbuminuria    Current Outpatient Medications on File Prior to Visit   Medication Sig Dispense Refill   albuterol (VENTOLIN HFA) 108 (90 Base) MCG/ACT inhaler Inhale 2 puffs into the lungs every 6 (six) hours as needed for wheezing or shortness of breath. 8.5 each 2   Dulaglutide (TRULICITY) 1.02 HE/5.2DP SOPN INJECT 0.75 MG SUBCUTANEOUSLY ONE TIME PER WEEK 6 mL 1   fluticasone furoate-vilanterol (BREO ELLIPTA) 200-25 MCG/ACT AEPB INHALE 1 PUFF BY MOUTH EVERY DAY 60 each 5   glucose blood test strip Use as instructed 100 each 12   Lancets (ONETOUCH ULTRASOFT) lancets Use as instructed 100 each 12   losartan (COZAAR) 25 MG tablet Take 1 tablet (25 mg total) by mouth daily. 90 tablet 3   rosuvastatin (CRESTOR) 20 MG tablet Take 1 tablet (20 mg total) by mouth daily. TAKE 1 TABLET BY MOUTH EVERY DAY IN THE EVENING 90 tablet 1   [DISCONTINUED] levalbuterol (XOPENEX HFA) 45 MCG/ACT inhaler Inhale 2 puffs into the lungs every 6 (six) hours as needed for wheezing. (Patient not taking: Reported on 04/25/2019) 1 Inhaler 12   [DISCONTINUED] metFORMIN (GLUCOPHAGE) 500 MG tablet TAKE 1 TABLET BY MOUTH EVERY DAY WITH BREAKFAST (Patient not taking: Reported on 04/19/2019) 90 tablet 3   No current facility-administered medications on file prior to visit.    Review of Systems As in subjective    Objective:   Physical Exam Due to coronavirus pandemic stay at home measures, patient visit was virtual  and they were not examined in person.   Wt 185 lb (83.9 kg)   BMI 30.09 kg/m   Gen: wd wn, nad No labored breathing or wheezing     Assessment:     Encounter Diagnoses  Name Primary?   COVID Yes   Acute cough        Plan:      General recommendations: I recommend you rest, hydrate well with water and clear fluids throughout the day.   You can use Tylenol for pain or fever You can use over the counter coricidin HBP or Mucinex DM for cough. You can use over the counter Emetrol for nausea.     Discussed medications below, particular Paxlovid, risks/benefits and  properly use of medication.  If you are having trouble breathing, if you are very weak, have high fever 103 or higher consistently despite Tylenol, or uncontrollable nausea and vomiting, then call or go to the emergency department.    If you have other questions or have other symptoms or questions you are concerned about then please make a virtual visit  Covid symptoms such as fatigue and cough can linger over 2 weeks, even after the initial fever, aches, chills, and other initial symptoms.   Self Quarantine: The CDC, Centers for Disease Control has recommended a self quarantine of 5 days from the start of your illness until you are symptom-free including at least 24 hours of no symptoms including no fever, no shortness of breath, and no body aches and chills, by day 5 before returning to work or general contact with the public.  What does self quarantine mean: avoiding contact with people as much as possible.   Particularly in your house, isolate your self from others in a separate room, wear a mask when possible in the room, particularly if coughing a lot.   Have others bring food, water, medications, etc., to your door, but avoid direct contact with your household contacts during this time to avoid spreading the infection to them.   If you have a separate bathroom and living quarters during the next 2 weeks away from others, that would be preferable.    If you can't completely isolate, then wear a mask, wash hands frequently with soap and water for at least 15 seconds, minimize close contact with others, and have a friend or family member check regularly from a distance to make sure you are not getting seriously worse.     You should not be going out in public, should not be going to stores, to work or other public places until all your symptoms have resolved and at least 5 days + 24 hours of no symptoms at all have transpired.   Ideally you should avoid contact with others for a full 5 days if  possible.  One of the goals is to limit spread to high risk people; people that are older and elderly, people with multiple health issues like diabetes, heart disease, lung disease, and anybody that has weakened immune systems such as people with cancer or on immunosuppressive therapy.      [facility] was seen today for covid positive.  Diagnoses and all orders for this visit:  COVID  Acute cough  Other orders -     nirmatrelvir/ritonavir (PAXLOVID) 20 x 150 MG & 10 x '100MG'$  TABS; Take 3 tablets by mouth 2 (two) times daily for 5 days. (Take nirmatrelvir 150 mg two tablets twice daily for 5 days and ritonavir 100 mg one tablet twice  daily for 5 days) Patient GFR is normal. -     Multiple Vitamins-Minerals (EMERGEN-C IMMUNE PLUS) PACK; Take 1 tablet by mouth 2 (two) times daily.    F/u prn

## 2022-06-25 ENCOUNTER — Ambulatory Visit: Payer: BC Managed Care – PPO | Admitting: Medical

## 2022-06-25 VITALS — BP 120/70 | HR 69 | Wt 181.8 lb

## 2022-06-25 DIAGNOSIS — E2839 Other primary ovarian failure: Secondary | ICD-10-CM

## 2022-06-25 DIAGNOSIS — Z780 Asymptomatic menopausal state: Secondary | ICD-10-CM

## 2022-06-25 DIAGNOSIS — J4540 Moderate persistent asthma, uncomplicated: Secondary | ICD-10-CM

## 2022-06-25 DIAGNOSIS — H9190 Unspecified hearing loss, unspecified ear: Secondary | ICD-10-CM

## 2022-06-25 DIAGNOSIS — E118 Type 2 diabetes mellitus with unspecified complications: Secondary | ICD-10-CM

## 2022-06-25 DIAGNOSIS — E785 Hyperlipidemia, unspecified: Secondary | ICD-10-CM

## 2022-06-25 DIAGNOSIS — Z7185 Encounter for immunization safety counseling: Secondary | ICD-10-CM

## 2022-06-25 DIAGNOSIS — Z124 Encounter for screening for malignant neoplasm of cervix: Secondary | ICD-10-CM

## 2022-06-25 DIAGNOSIS — Z1231 Encounter for screening mammogram for malignant neoplasm of breast: Secondary | ICD-10-CM

## 2022-06-25 DIAGNOSIS — R809 Proteinuria, unspecified: Secondary | ICD-10-CM

## 2022-06-25 DIAGNOSIS — Z139 Encounter for screening, unspecified: Secondary | ICD-10-CM

## 2022-06-25 DIAGNOSIS — Z111 Encounter for screening for respiratory tuberculosis: Secondary | ICD-10-CM

## 2022-06-25 LAB — HEMOGLOBIN A1C
Est. average glucose Bld gHb Est-mCnc: 146 mg/dL
Hgb A1c MFr Bld: 6.7 % — ABNORMAL HIGH (ref 4.8–5.6)

## 2022-06-25 NOTE — Addendum Note (Signed)
Addended by: Minette Headland A on: 06/25/2022 02:33 PM   Modules accepted: Orders

## 2022-06-25 NOTE — Progress Notes (Signed)
Completed extra things. Waiting for results

## 2022-06-25 NOTE — Progress Notes (Addendum)
Subjective:  Stacey Simon is a 62 y.o. female who presents for Chief Complaint  Patient presents with   diabetes    Diabetes no concerns     Here for chronic disease follow-up and med check.  Diabetes-compliant with Trulicity A999333 mg weekly.  Not checking sugars often.  Often hurts to use lancet and half the time enough blood doesn't come out.  No polydipsia, no polyuria , no blurred vision.    Hyperlipidemia-compliant with Crestor 20 mg daily without complaint  microalbuminuria-compliant with losartan 25 mg daily without complaint  Asthma-no recent issues  No other aggravating or relieving factors.    No other c/o.  Past Medical History:  Diagnosis Date   Allergy    Anemia    in college   Asthma    a few prior hospitalizations, last 1992   Diabetes mellitus without complication ([facility]) [id number]   Hyperlipidemia    Insomnia    Low HDL (under 40) 08/2015   Microalbuminuria    Current Outpatient Medications on File Prior to Visit  Medication Sig Dispense Refill   albuterol (VENTOLIN HFA) 108 (90 Base) MCG/ACT inhaler Inhale 2 puffs into the lungs every 6 (six) hours as needed for wheezing or shortness of breath. 8.5 each 2   Dulaglutide (TRULICITY) A999333 0000000 SOPN INJECT 0.75 MG SUBCUTANEOUSLY ONE TIME PER WEEK 6 mL 1   glucose blood test strip Use as instructed 100 each 12   Lancets (ONETOUCH ULTRASOFT) lancets Use as instructed 100 each 12   losartan (COZAAR) 25 MG tablet Take 1 tablet (25 mg total) by mouth daily. 90 tablet 3   rosuvastatin (CRESTOR) 20 MG tablet Take 1 tablet (20 mg total) by mouth daily. TAKE 1 TABLET BY MOUTH EVERY DAY IN THE EVENING 90 tablet 1   fluticasone furoate-vilanterol (BREO ELLIPTA) 200-25 MCG/ACT AEPB INHALE 1 PUFF BY MOUTH EVERY DAY (Patient not taking: Reported on 06/25/2022) 60 each 5   [DISCONTINUED] levalbuterol (XOPENEX HFA) 45 MCG/ACT inhaler Inhale 2 puffs into the lungs every 6 (six) hours as needed for wheezing.  (Patient not taking: Reported on 04/25/2019) 1 Inhaler 12   [DISCONTINUED] metFORMIN (GLUCOPHAGE) 500 MG tablet TAKE 1 TABLET BY MOUTH EVERY DAY WITH BREAKFAST (Patient not taking: Reported on 04/19/2019) 90 tablet 3   No current facility-administered medications on file prior to visit.    The following portions of the patient's history were reviewed and updated as appropriate: allergies, current medications, past family history, past medical history, past social history, past surgical history and problem list.  ROS Otherwise as in subjective above    Objective: BP 120/70   Pulse 69   Wt 181 lb 12.8 oz (82.5 kg)   BMI 29.57 kg/m   General appearance: alert, no distress, well developed, well nourished HEENT: normocephalic, sclerae anicteric, conjunctiva pink and moist, TMs pearly, nares patent, no discharge or erythema, pharynx normal Oral cavity: MMM, no lesions Neck: supple, no lymphadenopathy, no thyromegaly, no masses Heart: RRR, normal S1, S2, no murmurs Lungs: CTA bilaterally, no wheezes, rhonchi, or rales Pulses: 2+ radial pulses, 2+ pedal pulses, normal cap refill Ext: no edema  Diabetic Foot Exam - Simple   Simple Foot Form Diabetic Foot exam was performed with the following findings: Yes 06/25/2022  1:57 PM  Visual Inspection See comments: Yes Sensation Testing Intact to touch and monofilament testing bilaterally: Yes Pulse Check Posterior Tibialis and Dorsalis pulse intact bilaterally: Yes Comments Some hypertrophy bilat of toenails  Assessment: Encounter Diagnoses  Name Primary?   Diabetes mellitus with complication (HCC) Yes   Vaccine counseling    Hyperlipidemia, unspecified hyperlipidemia type    Moderate persistent asthma, unspecified whether complicated    Screening for cervical cancer    Encounter for screening mammogram for malignant neoplasm of breast    Microalbuminuria    Post-menopausal    Estrogen deficiency    Screening for condition     Screening for tuberculosis    Hearing loss, unspecified hearing loss type, unspecified laterality      Plan: Diabetes - will get her started on freestyle libre CGM.   Continue current medicaiton, labs today   Hyperlipidemia - continue Crestor/Rosuvastatin daily alon with healthy low cholesterol diet   Microalbuminuria - continue losartan for kidney protection, hydrate well with water daily, avoid excessive ibuprofen, aleve or other NSAIDs   Mild asthma intermittent-compliant with preventative medication.  no recent issues   Vaccine counseling: I recommend shingrix vaccine    Screen for breast cancer Advised update mammogram  Please call to schedule your mammogram and bone density test for osteoporosis screening.   [facility]  [phone] [address], [place], [place] [address]    Screen for colon cancer Has appt for repeat colonoscopy in 08/2021.   At the end of the visit she brought in a form that need to be completed the required several pieces of information.  Will work on the form for employment   Stacey Simon was seen today for diabetes.  Diagnoses and all orders for this visit:  Diabetes mellitus with complication ([facility]) -     Hemoglobin A1c -     VITAMIN D 25 Hydroxy (Vit-D Deficiency, Fractures)  Vaccine counseling  Hyperlipidemia, unspecified hyperlipidemia type  Moderate persistent asthma, unspecified whether complicated  Screening for cervical cancer  Encounter for screening mammogram for malignant neoplasm of breast -     MM DIGITAL SCREENING BILATERAL; Future  Microalbuminuria -     VITAMIN D 25 Hydroxy (Vit-D Deficiency, Fractures)  Post-menopausal -     DG Bone Density; Future -     VITAMIN D 25 Hydroxy (Vit-D Deficiency, Fractures)  Estrogen deficiency -     DG Bone Density; Future -     VITAMIN D 25 Hydroxy (Vit-D Deficiency, Fractures)  Screening for condition -     Measles/Mumps/Rubella  Immunity -     Cancel: QuantiFERON-TB Gold Plus -     Hepatitis B surface antibody,quantitative  Screening for tuberculosis -     Measles/Mumps/Rubella Immunity -     Cancel: QuantiFERON-TB Gold Plus -     Hepatitis B surface antibody,quantitative -     QuantiFERON-TB Gold Plus  Hearing loss, unspecified hearing loss type, unspecified laterality   Follow up: pending labs

## 2022-06-25 NOTE — Addendum Note (Signed)
Addended by: Carlena Hurl on: 06/25/2022 02:35 PM   Modules accepted: Orders

## 2022-06-25 NOTE — Patient Instructions (Signed)
Diabetes - will get her started on freestyle libre CGM.   Continue current medicaiton, labs today   Hyperlipidemia - continue Crestor/Rosuvastatin daily alon with healthy low cholesterol diet   Microalbuminuria - continue losartan for kidney protection, hydrate well with water daily, avoid excessive ibuprofen, aleve or other NSAIDs   Mild asthma intermittent-compliant with preventative medication.  no recent issues   Vaccine counseling: I recommend shingrix vaccine    Screen for breast cancer Advised update mammogram  Please call to schedule your mammogram and bone density test for osteoporosis screening.   [facility]  [phone] [address], [place],  [address]    Screen for colon cancer Has appt for repeat colonoscopy in 08/2021.

## 2022-06-26 ENCOUNTER — Other Ambulatory Visit: Payer: Self-pay | Admitting: Medical

## 2022-06-26 LAB — MEASLES/MUMPS/RUBELLA IMMUNITY
MUMPS ABS, IGG: 192 AU/mL (ref >10.9)
RUBEOLA AB, IGG: 168 AU/mL (ref >16.4)
Rubella Antibodies, IGG: 8.23 index (ref >0.99)

## 2022-06-26 LAB — HEPATITIS B SURFACE ANTIBODY, QUANTITATIVE: Hepatitis B Surf Ab Quant: 13.9 m[IU]/mL (ref >9.9)

## 2022-06-26 LAB — VITAMIN D 25 HYDROXY (VIT D DEFICIENCY, FRACTURES): Vit D, 25-Hydroxy: 8.5 ng/mL — ABNORMAL LOW (ref 30.0–100.0)

## 2022-06-26 MED ORDER — FLUTICASONE FUROATE-VILANTEROL 200-25 MCG/ACT IN AEPB: INHALATION_SPRAY | RESPIRATORY_TRACT | 5 refills | Status: DC

## 2022-06-26 MED ORDER — ROSUVASTATIN CALCIUM 20 MG PO TABS: 20.0000 mg | ORAL_TABLET | Freq: Every day | ORAL | 2 refills | Status: DC

## 2022-06-26 MED ORDER — VITAMIN D (ERGOCALCIFEROL) 1.25 MG (50000 UNIT) PO CAPS: 50000.0000 [IU] | ORAL_CAPSULE | ORAL | 0 refills | Status: DC

## 2022-06-26 MED ORDER — ALBUTEROL SULFATE HFA 108 (90 BASE) MCG/ACT IN AERS: 2.0000 | INHALATION_SPRAY | Freq: Four times a day (QID) | RESPIRATORY_TRACT | 2 refills | Status: DC | PRN

## 2022-06-26 MED ORDER — TRULICITY 0.75 MG/0.5ML ~~LOC~~ SOAJ: SUBCUTANEOUS | 3 refills | Status: DC

## 2022-06-26 MED ORDER — FREESTYLE LIBRE 3 SENSOR MISC: 1.0000 | 11 refills | Status: DC

## 2022-06-26 NOTE — Progress Notes (Signed)
Results sent through MyChart

## 2022-06-29 LAB — QUANTIFERON-TB GOLD PLUS
QuantiFERON Mitogen Value: >10 IU/mL
QuantiFERON Nil Value: 0.02 IU/mL
QuantiFERON TB1 Ag Value: 0.04 IU/mL
QuantiFERON TB2 Ag Value: 0.04 IU/mL
QuantiFERON-TB Gold Plus: NEGATIVE

## 2022-07-01 NOTE — Progress Notes (Signed)
Results sent through MyChart

## 2022-08-15 ENCOUNTER — Ambulatory Visit
Admission: RE | Admit: 2022-08-15 | Discharge: 2022-08-15 | Disposition: A | Discharge status: Home / Self Care | Payer: BC Managed Care – PPO | Source: Ambulatory Visit | Attending: Medical | Admitting: Medical

## 2022-08-15 ENCOUNTER — Other Ambulatory Visit: Payer: BC Managed Care – PPO

## 2022-08-15 DIAGNOSIS — Z1231 Encounter for screening mammogram for malignant neoplasm of breast: Secondary | ICD-10-CM

## 2022-08-16 ENCOUNTER — Ambulatory Visit
Admission: RE | Admit: 2022-08-16 | Discharge: 2022-08-16 | Disposition: A | Discharge status: Home / Self Care | Payer: BC Managed Care – PPO | Source: Ambulatory Visit | Attending: Medical | Admitting: Medical

## 2022-08-16 DIAGNOSIS — Z780 Asymptomatic menopausal state: Secondary | ICD-10-CM

## 2022-08-16 DIAGNOSIS — E2839 Other primary ovarian failure: Secondary | ICD-10-CM

## 2022-08-16 NOTE — Progress Notes (Signed)
Results sent through MyChart

## 2022-08-19 NOTE — Progress Notes (Signed)
Results sent through MyChart

## 2022-08-21 ENCOUNTER — Encounter: Payer: Self-pay | Admitting: Family Medicine

## 2022-08-21 ENCOUNTER — Ambulatory Visit: Payer: BC Managed Care – PPO | Admitting: Family Medicine

## 2022-08-21 VITALS — BP 136/84 | HR 74 | Temp 98.4°F | Wt 178.8 lb

## 2022-08-21 DIAGNOSIS — E118 Type 2 diabetes mellitus with unspecified complications: Secondary | ICD-10-CM

## 2022-08-21 DIAGNOSIS — R519 Headache, unspecified: Secondary | ICD-10-CM | POA: Diagnosis not present

## 2022-08-21 LAB — POC COVID19 BINAXNOW: SARS Coronavirus 2 Ag: NEGATIVE

## 2022-08-21 MED ORDER — KETOROLAC TROMETHAMINE 60 MG/2ML IM SOLN
60.0000 mg | Freq: Once | INTRAMUSCULAR | Status: AC
Start: 2022-08-21 — End: 2022-08-21
  Administered 2022-08-21: 60 mg via INTRAMUSCULAR

## 2022-08-21 NOTE — Progress Notes (Signed)
Chief Complaint  Patient presents with   other    Head ache since Sunday, no fever, no cough or ST,     Sunday, she started having a headache around 7-8am, while at work.  It is described as a throbbing at both temples, and around the head. Worse if she coughs. Sometimes seems worse with standing (but no change when standing in the office today). No light or sound sensitivity. No nausea or vomiting. Denies grinding or clenching teeth.   Denies significant stressors. Denies any congestion, sneezing, sore throat, cough, itchy eyes.  New medication--prescription Vitamin D; took the 2nd dose on Sunday, the day that HA started. Does Trulicity on Saturdays.  Tylenol helps for a short while. Hasn't tried ibuprofen.  PMH, PSH, SH reviewed Pt wih DM, asthma, hyperlipidemia.  Lab Results  Component Value Date   CREATININE 0.91 01/09/2022   Lab Results  Component Value Date   HGBA1C 6.7 (H) 06/25/2022   She takes losartan for her kidneys, denies h/o HTN. She isn't wearing sensor.  Glucose was 141 today, after coffee, before eating. BP was checked by the nurse at school today, recalls it was "okay".  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  Outpatient Encounter Medications as of 08/21/2022  Medication Sig   albuterol (VENTOLIN HFA) 108 (90 Base) MCG/ACT inhaler Inhale 2 puffs into the lungs every 6 (six) hours as needed for wheezing or shortness of breath.   Continuous Blood Gluc Sensor (FREESTYLE LIBRE 3 SENSOR) MISC 1 each by Does not apply route every 14 (fourteen) days. Place 1 sensor on the skin every 14 days. Use to check glucose continuously   Dulaglutide (TRULICITY) 0.75 MG/0.5ML SOPN INJECT 0.75 MG SUBCUTANEOUSLY ONE TIME PER WEEK   fluticasone furoate-vilanterol (BREO ELLIPTA) 200-25 MCG/ACT AEPB INHALE 1 PUFF BY MOUTH EVERY DAY   glucose blood test strip Use as instructed   Lancets (ONETOUCH ULTRASOFT) lancets Use as instructed   losartan (COZAAR) 25 MG tablet  Take 1 tablet (25 mg total) by mouth daily.   rosuvastatin (CRESTOR) 20 MG tablet Take 1 tablet (20 mg total) by mouth daily. TAKE 1 TABLET BY MOUTH EVERY DAY IN THE EVENING   Vitamin D, Ergocalciferol, (DRISDOL) 1.25 MG (50000 UNIT) CAPS capsule Take 1 capsule (50,000 Units total) by mouth every 7 (seven) days.   [DISCONTINUED] levalbuterol (XOPENEX HFA) 45 MCG/ACT inhaler Inhale 2 puffs into the lungs every 6 (six) hours as needed for wheezing. (Patient not taking: Reported on 04/25/2019)   [DISCONTINUED] metFORMIN (GLUCOPHAGE) 500 MG tablet TAKE 1 TABLET BY MOUTH EVERY DAY WITH BREAKFAST (Patient not taking: Reported on 04/19/2019)   No facility-administered encounter medications on file as of 08/21/2022.   No Known Allergies   ROS: HA per HPI. No fever, chlils, body aches, URI symptoms. No n/v/d, urinary complaints, bleeding, bruising, rash, myalgia, or any other complaints, except as noted in HPI.   PHYSICAL EXAM:  BP 136/84   Pulse 74   Temp 98.4 F (36.9 C)   Wt 178 lb 12.8 oz (81.1 kg)   BMI 29.08 kg/m   154/76 on repeat by DM  Wt Readings from Last 3 Encounters:  08/21/22 178 lb 12.8 oz (81.1 kg)  06/25/22 181 lb 12.8 oz (82.5 kg)  06/12/22 185 lb (83.9 kg)   Pleasant female, in no distress HEENT: conjunctiva and sclera are clear, EOMI. Fundi benign. TM's and EAC's normal. Nasal mucosa is normal, sinuses nontender. OP is clear. Nontender at temporal arteries, temporalis  muscles Neck: no lymphadenopathy, thyromegaly or mass Heart: regular rate and rhythm Lungs: clear bilaterally Back: no spinal or CVA tenderness Abdomen: soft, nontender, no organomegaly or mass Extremities: no edema Neuro: alert and oriented, cranial nerves intact. Normal finger to nose, gait, strength, sensation. DTR's 2+ and symmetric in upper and lower extremities. Psych: normal mood, affect, hygiene and grooming  Rapid COVID test negative   ASSESSMENT/PLAN:  Acute nonintractable headache,  unspecified headache type - throbbing, bitemporal HA, nontender.  No other symptoms, consider tension HA. Toradol given; risks/SE of NSAIDs reviewed. f/u if persists/worsens - Plan: POC COVID-19, ketorolac (TORADOL) injection 60 mg  Diabetes mellitus with complication - controlled, with no hyperglycemia  BP mildly elevated, but in pain. To continue to monitor elsewhere, and cont losartan at current dose.   We gave you an injection of toradol (anti-inflammatory) for your headache. You must wait 6 hours before taking any ibuprofen (advil/motrin) or Aleve (naproxen).  Be sure to take these with food. These are other anti-inflammatories, which might be more effective than the Tylenol.  It is okay to use these short-term (but tylenol is generally safer to use for pain or headaches).   Please schedule your routine diabetic eye exam. Please check with your insurance regarding the coverage of the continuous glucose monitor [facility] [place]).

## 2022-08-21 NOTE — Patient Instructions (Signed)
  We gave you an injection of toradol (anti-inflammatory) for your headache. You must wait 6 hours before taking any ibuprofen (advil/motrin) or Aleve (naproxen).  Be sure to take these with food. These are other anti-inflammatories, which might be more effective than the Tylenol.  It is okay to use these short-term (but tylenol is generally safer to use for pain or headaches).   Please schedule your routine diabetic eye exam. Please check with your insurance regarding the coverage of the continuous glucose monitor [facility]).

## 2022-08-23 ENCOUNTER — Telehealth: Payer: Self-pay

## 2022-08-23 ENCOUNTER — Other Ambulatory Visit: Payer: Self-pay

## 2022-08-23 ENCOUNTER — Emergency Department (HOSPITAL_COMMUNITY)
Admission: EM | Admit: 2022-08-23 | Discharge: 2022-08-24 | Disposition: A | Discharge status: Home / Self Care | Payer: BC Managed Care – PPO | Attending: Emergency Medicine | Admitting: Emergency Medicine

## 2022-08-23 ENCOUNTER — Other Ambulatory Visit: Payer: Self-pay | Admitting: Medical

## 2022-08-23 ENCOUNTER — Encounter (HOSPITAL_COMMUNITY): Payer: Self-pay | Admitting: Emergency Medicine

## 2022-08-23 DIAGNOSIS — J45909 Unspecified asthma, uncomplicated: Secondary | ICD-10-CM | POA: Insufficient documentation

## 2022-08-23 DIAGNOSIS — Z7984 Long term (current) use of oral hypoglycemic drugs: Secondary | ICD-10-CM | POA: Diagnosis not present

## 2022-08-23 DIAGNOSIS — R519 Headache, unspecified: Secondary | ICD-10-CM | POA: Diagnosis not present

## 2022-08-23 DIAGNOSIS — E119 Type 2 diabetes mellitus without complications: Secondary | ICD-10-CM | POA: Insufficient documentation

## 2022-08-23 DIAGNOSIS — Z794 Long term (current) use of insulin: Secondary | ICD-10-CM | POA: Insufficient documentation

## 2022-08-23 DIAGNOSIS — Z7951 Long term (current) use of inhaled steroids: Secondary | ICD-10-CM | POA: Diagnosis not present

## 2022-08-23 MED ORDER — OXYCODONE-ACETAMINOPHEN 5-325 MG PO TABS: 1.0000 | ORAL_TABLET | Freq: Four times a day (QID) | ORAL | 0 refills | Status: DC | PRN

## 2022-08-23 NOTE — ED Triage Notes (Signed)
Pt reports headache. Pt states she went to her pcp and was given injection that provided slight relief but headache has since returned and home meds has not helped.

## 2022-08-23 NOTE — Telephone Encounter (Signed)
Pt called and advised after the shot wore off that she received at her last appt, her headaches returned. She said her head is pounding and she is wanting to know what should she do?

## 2022-08-24 ENCOUNTER — Emergency Department (HOSPITAL_COMMUNITY): Payer: BC Managed Care – PPO

## 2022-08-24 LAB — BASIC METABOLIC PANEL
Anion gap: 6 (ref 5–15)
BUN: 12 mg/dL (ref 8–23)
CO2: 25 mmol/L (ref 22–32)
Calcium: 9 mg/dL (ref 8.9–10.3)
Chloride: 107 mmol/L (ref 98–111)
Creatinine, Ser: 0.82 mg/dL (ref 0.44–1.00)
GFR, Estimated: >60 mL/min (ref >60)
Glucose, Bld: 166 mg/dL — ABNORMAL HIGH (ref 70–99)
Potassium: 4 mmol/L (ref 3.5–5.1)
Sodium: 138 mmol/L (ref 135–145)

## 2022-08-24 LAB — CBC
HCT: 36.1 % (ref 36.0–46.0)
Hemoglobin: 11.7 g/dL — ABNORMAL LOW (ref 12.0–15.0)
MCH: 27.7 pg (ref 26.0–34.0)
MCHC: 32.4 g/dL (ref 30.0–36.0)
MCV: 85.3 fL (ref 80.0–100.0)
Platelets: 291 10*3/uL (ref 150–400)
RBC: 4.23 MIL/uL (ref 3.87–5.11)
RDW: 13.6 % (ref 11.5–15.5)
WBC: 8.5 10*3/uL (ref 4.0–10.5)
nRBC: 0 % (ref 0.0–0.2)

## 2022-08-24 MED ORDER — DEXAMETHASONE SODIUM PHOSPHATE 10 MG/ML IJ SOLN
10.0000 mg | Freq: Once | INTRAMUSCULAR | Status: AC
  Administered 2022-08-24: 10 mg via INTRAVENOUS
  Filled 2022-08-24: qty 1

## 2022-08-24 MED ORDER — KETOROLAC TROMETHAMINE 15 MG/ML IJ SOLN
15.0000 mg | Freq: Once | INTRAMUSCULAR | Status: AC
  Administered 2022-08-24: 15 mg via INTRAVENOUS
  Filled 2022-08-24: qty 1

## 2022-08-24 MED ORDER — SODIUM CHLORIDE 0.9 % IV BOLUS
1000.0000 mL | Freq: Once | INTRAVENOUS | Status: AC
  Administered 2022-08-24: 1000 mL via INTRAVENOUS

## 2022-08-24 MED ORDER — SODIUM CHLORIDE 0.9 % IV SOLN: INTRAVENOUS | Status: DC

## 2022-08-24 MED ORDER — PROCHLORPERAZINE EDISYLATE 10 MG/2ML IJ SOLN
10.0000 mg | Freq: Once | INTRAMUSCULAR | Status: AC
  Administered 2022-08-24: 10 mg via INTRAVENOUS
  Filled 2022-08-24: qty 2

## 2022-08-24 NOTE — ED Provider Notes (Signed)
[place] EMERGENCY DEPARTMENT AT [facility] Provider Note   CSN: [id number] Arrival date & time: 08/23/22  2257     History  Chief Complaint  Patient presents with   Headache   HPI Stacey Simon is a 62 y.o. female with asthma, anemia, and diabetes and hyperlipidemia presenting for headache.  Started on Sunday.  Started with a gradual onset and is progressively worsened throughout the week.  Pain is all over her head.  Endorsing photophobia and phonophobia.  Denies visual disturbance, fever and nuchal rigidity.  Denies any other focal neurosymptoms.  States she went to her PCP on Wednesday and received Toradol which helped but symptoms returned about 6 hours later.  Took some Aleve yesterday which also helped.    Headache      Home Medications Prior to Admission medications   Medication Sig Start Date End Date Taking? Authorizing Provider  albuterol (VENTOLIN HFA) 108 (90 Base) MCG/ACT inhaler Inhale 2 puffs into the lungs every 6 (six) hours as needed for wheezing or shortness of breath. 06/26/22   Tysinger, Kermit Balo, PA-C  Continuous Blood Gluc Sensor (FREESTYLE LIBRE 3 SENSOR) MISC 1 each by Does not apply route every 14 (fourteen) days. Place 1 sensor on the skin every 14 days. Use to check glucose continuously 06/26/22   Tysinger, Kermit Balo, PA-C  Dulaglutide (TRULICITY) 0.75 MG/0.5ML SOPN INJECT 0.75 MG SUBCUTANEOUSLY ONE TIME PER WEEK 06/26/22   Tysinger, Kermit Balo, PA-C  fluticasone furoate-vilanterol (BREO ELLIPTA) 200-25 MCG/ACT AEPB INHALE 1 PUFF BY MOUTH EVERY DAY 06/26/22   Tysinger, Kermit Balo, PA-C  glucose blood test strip Use as instructed 10/02/18   Tysinger, Kermit Balo, PA-C  Lancets [facility] ULTRASOFT) lancets Use as instructed 10/02/18   Tysinger, Kermit Balo, PA-C  losartan (COZAAR) 25 MG tablet Take 1 tablet (25 mg total) by mouth daily. 01/17/22   Tysinger, Kermit Balo, PA-C  oxyCODONE-acetaminophen (PERCOCET) 5-325 MG tablet Take 1 tablet by mouth every 6  (six) hours as needed for severe pain. 08/23/22 08/23/23  Tysinger, Kermit Balo, PA-C  rosuvastatin (CRESTOR) 20 MG tablet Take 1 tablet (20 mg total) by mouth daily. TAKE 1 TABLET BY MOUTH EVERY DAY IN THE EVENING 06/26/22   Tysinger, Kermit Balo, PA-C  Vitamin D, Ergocalciferol, (DRISDOL) 1.25 MG (50000 UNIT) CAPS capsule Take 1 capsule (50,000 Units total) by mouth every 7 (seven) days. 06/26/22   Tysinger, Kermit Balo, PA-C  levalbuterol [facility] HFA) 45 MCG/ACT inhaler Inhale 2 puffs into the lungs every 6 (six) hours as needed for wheezing. Patient not taking: Reported on 04/25/2019 09/25/18 04/25/19  Ronnald Nian, MD  metFORMIN (GLUCOPHAGE) 500 MG tablet TAKE 1 TABLET BY MOUTH EVERY DAY WITH BREAKFAST Patient not taking: Reported on 04/19/2019 09/17/18 04/25/19  Tysinger, Kermit Balo, PA-C      Allergies    Patient has no known allergies.    Review of Systems   Review of Systems  Neurological:  Positive for headaches.    Physical Exam   Vitals:   08/24/22 0800 08/24/22 0900  BP: (!) 151/71 (!) 142/67  Pulse:  72  Resp:  16  Temp:    SpO2:  99%    CONSTITUTIONAL:  well-appearing, NAD NEURO:  GCS 15. Speech is goal oriented. No deficits appreciated to CN III-XII; symmetric eyebrow raise, no facial drooping, tongue midline. Patient has equal grip strength bilaterally with 5/5 strength against resistance in all major muscle groups bilaterally. Sensation to light touch intact. Patient moves extremities  without ataxia. Normal finger-nose-finger. Patient ambulatory with steady gait. EYES:  eyes equal and reactive ENT/NECK:  Supple, no stridor  CARDIO:  regular rate and rhythm, appears well-perfused  PULM:  No respiratory distress, CTAB GI/GU:  non-distended MSK/SPINE:  No gross deformities, no edema, moves all extremities  SKIN:  no rash, atraumatic  *Additional and/or pertinent findings included in MDM below   ED Results / Procedures / Treatments   Labs (all labs ordered are listed, but only  abnormal results are displayed) Labs Reviewed  BASIC METABOLIC PANEL - Abnormal; Notable for the following components:      Result Value   Glucose, Bld 166 (*)    All other components within normal limits  CBC - Abnormal; Notable for the following components:   Hemoglobin 11.7 (*)    All other components within normal limits    EKG None  Radiology CT HEAD WO CONTRAST  Result Date: 08/24/2022 CLINICAL DATA:  Headache, increasing frequency or severity. EXAM: CT HEAD WITHOUT CONTRAST TECHNIQUE: Contiguous axial images were obtained from the base of the skull through the vertex without intravenous contrast. RADIATION DOSE REDUCTION: This exam was performed according to the departmental dose-optimization program which includes automated exposure control, adjustment of the mA and/or kV according to patient size and/or use of iterative reconstruction technique. COMPARISON:  None Available. FINDINGS: Brain: Ventricles are within normal limits in size and configuration. There is no mass, hemorrhage, edema or other evidence of acute parenchymal abnormality. No extra-axial hemorrhage. Vascular: No hyperdense vessel or unexpected calcification. Skull: Normal. Negative for fracture or focal lesion. Sinuses/Orbits: No acute finding. Other: None. IMPRESSION: Negative head CT. No intracranial mass, hemorrhage or edema. Electronically Signed   By: Bary Richard M.D.   On: 08/24/2022 08:58    Procedures Procedures    Medications Ordered in ED Medications  sodium chloride 0.9 % bolus 1,000 mL (0 mLs Intravenous Stopped 08/24/22 0900)    And  0.9 %  sodium chloride infusion (0 mLs Intravenous Hold 08/24/22 0730)  ketorolac (TORADOL) 15 MG/ML injection 15 mg (15 mg Intravenous Given 08/24/22 0726)  prochlorperazine (COMPAZINE) injection 10 mg (10 mg Intravenous Given 08/24/22 0726)  dexamethasone (DECADRON) injection 10 mg (10 mg Intravenous Given 08/24/22 1610)    ED Course/ Medical Decision Making/ A&P                              Medical Decision Making Amount and/or Complexity of Data Reviewed Labs: ordered. Radiology: ordered.  Risk Prescription drug management.   62 year old well-appearing female presenting for headache.  Exam was unremarkable without focal neurodeficits.  DDx includes cerebral venous thrombosis, hypertensive emergency, electrolyte derangement, migraine or tension type headache.  Overall patient appears clinically well.  Had low suspicion for any acute intracranial pathology.  CT scan was also negative.  Patient stated she felt much better after headache cocktail and expressed a desire to be discharged.  Given how clinically well she appeared, thought that that was appropriate with follow-up with neurology.  Discussed return precautions.  Vital stable at discharge.        Final Clinical Impression(s) / ED Diagnoses Final diagnoses:  Nonintractable headache, unspecified chronicity pattern, unspecified headache type    Rx / DC Orders ED Discharge Orders     None         Gareth Eagle, PA-C 08/24/22 0930    Terald Sleeper, MD 08/24/22 [phone]

## 2022-08-24 NOTE — Discharge Instructions (Addendum)
Evaluation today for your headache was overall reassuring.  Given how long it has lasted, I do think you should follow-up with neurology.  I have provided contact information for local neurologist in your discharge summary.  If you have new facial droop, slurred speech, weakness or numbness in your extremities, fever or neck stiffness, or visual disturbance or any other concerning symptom please return emergency department further evaluation.

## 2022-08-26 ENCOUNTER — Telehealth: Payer: Self-pay | Admitting: Internal Medicine

## 2022-08-26 NOTE — Telephone Encounter (Signed)
Pt will come in the am to be seen

## 2022-08-26 NOTE — Telephone Encounter (Signed)
Pt advised she went to ER on Friday night and received a referral to see a urologist. She stated she didn't pick the medication up until yesterday. I provided her Caryn's number to help with the referral.

## 2022-08-26 NOTE — Telephone Encounter (Signed)
Pt called and states that she is still having migraines. She took oxycodone yesterday and it helped and her headache came back today and pt took another one at 12:30-1pm but it has not helped at all ease the pain. Pt has neurology appt in June but can't wait until then. Please advise her what next step is. She said she can not be taking this medication everyday for her migraines

## 2022-08-27 ENCOUNTER — Ambulatory Visit: Payer: BC Managed Care – PPO | Admitting: Medical

## 2022-08-27 ENCOUNTER — Encounter: Payer: Self-pay | Admitting: Medical

## 2022-08-27 VITALS — BP 150/80 | HR 60 | Ht 66.0 in | Wt 180.8 lb

## 2022-08-27 DIAGNOSIS — R809 Proteinuria, unspecified: Secondary | ICD-10-CM

## 2022-08-27 DIAGNOSIS — E118 Type 2 diabetes mellitus with unspecified complications: Secondary | ICD-10-CM | POA: Diagnosis not present

## 2022-08-27 DIAGNOSIS — R519 Headache, unspecified: Secondary | ICD-10-CM | POA: Diagnosis not present

## 2022-08-27 DIAGNOSIS — Z8616 Personal history of COVID-19: Secondary | ICD-10-CM | POA: Diagnosis not present

## 2022-08-27 DIAGNOSIS — E785 Hyperlipidemia, unspecified: Secondary | ICD-10-CM

## 2022-08-27 DIAGNOSIS — J4540 Moderate persistent asthma, uncomplicated: Secondary | ICD-10-CM

## 2022-08-27 NOTE — Progress Notes (Signed)
Subjective:  Stacey Simon is a 62 y.o. female who presents for Chief Complaint  Patient presents with   Headache    Has been having heachaches, started 2 Sundays ago. Has been taking tylenol. Saw Dr. Lynelle Doctor and also went to ER Friday night.    Here for follow up on headaches.   She notes prior bad recurrent headaches when she had covid 4-5 months ago.  Denies history of headache disorder otherwise.   She came in 08/21/22 to see Dr. Lynelle Doctor for bad headache.   Her headache had started about 3 days prior to that visit.  Headaches have persisted.   She went to emergency dept for headache 2 days later.   Today is the best she has felt since the 08/21/22 visit.  No recent fever, night sweats, or recent illness.  She has hx/o hypertension, but denies elevated BP readings prior to the headaches.     Blood sugars lately have been ok, 114-140s fasting.   Regarding possible headache triggers, no recent major stressors or new stressors.    Doesn't sleep a lot as she is staying up late watching tv.    Getting less than 6 hours of sleep.     She has not seen her eye doctor in the past year.  She has not seen the dentist recently either.  No other aggravating or relieving factors.    No other c/o.  Past Medical History:  Diagnosis Date   Allergy    Anemia    in college   Asthma    a few prior hospitalizations, last 1992   Diabetes mellitus without complication 08/2015   Hyperlipidemia    Insomnia    Low HDL (under 40) 08/2015   Microalbuminuria    Current Outpatient Medications on File Prior to Visit  Medication Sig Dispense Refill   Dulaglutide (TRULICITY) 0.75 MG/0.5ML SOPN INJECT 0.75 MG SUBCUTANEOUSLY ONE TIME PER WEEK 6 mL 3   fluticasone furoate-vilanterol (BREO ELLIPTA) 200-25 MCG/ACT AEPB INHALE 1 PUFF BY MOUTH EVERY DAY 60 each 5   glucose blood test strip Use as instructed 100 each 12   Lancets (ONETOUCH ULTRASOFT) lancets Use as instructed 100 each 12   losartan  (COZAAR) 25 MG tablet Take 1 tablet (25 mg total) by mouth daily. 90 tablet 3   rosuvastatin (CRESTOR) 20 MG tablet Take 1 tablet (20 mg total) by mouth daily. TAKE 1 TABLET BY MOUTH EVERY DAY IN THE EVENING 90 tablet 2   Vitamin D, Ergocalciferol, (DRISDOL) 1.25 MG (50000 UNIT) CAPS capsule Take 1 capsule (50,000 Units total) by mouth every 7 (seven) days. 12 capsule 0   albuterol (VENTOLIN HFA) 108 (90 Base) MCG/ACT inhaler Inhale 2 puffs into the lungs every 6 (six) hours as needed for wheezing or shortness of breath. (Patient not taking: Reported on 08/27/2022) 8.5 each 2   Continuous Blood Gluc Sensor (FREESTYLE LIBRE 3 SENSOR) MISC 1 each by Does not apply route every 14 (fourteen) days. Place 1 sensor on the skin every 14 days. Use to check glucose continuously (Patient not taking: Reported on 08/27/2022) 2 each 11   oxyCODONE-acetaminophen (PERCOCET) 5-325 MG tablet Take 1 tablet by mouth every 6 (six) hours as needed for severe pain. (Patient not taking: Reported on 08/27/2022) 12 tablet 0   [DISCONTINUED] levalbuterol (XOPENEX HFA) 45 MCG/ACT inhaler Inhale 2 puffs into the lungs every 6 (six) hours as needed for wheezing. (Patient not taking: Reported on 04/25/2019) 1 Inhaler 12   [DISCONTINUED]  metFORMIN (GLUCOPHAGE) 500 MG tablet TAKE 1 TABLET BY MOUTH EVERY DAY WITH BREAKFAST (Patient not taking: Reported on 04/19/2019) 90 tablet 3   No current facility-administered medications on file prior to visit.    The following portions of the patient's history were reviewed and updated as appropriate: allergies, current medications, past family history, past medical history, past social history, past surgical history and problem list.  ROS Otherwise as in subjective above  Objective: BP (!) 150/80   Pulse 60   Ht  (1.676 m)   Wt 180 lb 12.8 oz (82 kg)   BMI 29.18 kg/m   BP Readings from Last 3 Encounters:  08/27/22 (!) 150/80  08/24/22 (!) 151/88  08/21/22 136/84    General  appearance: alert, no distress, well developed, well nourished HEENT: normocephalic, sclerae anicteric, conjunctiva pink and moist, TMs pearly, nares patent, no discharge or erythema, pharynx normal Oral cavity: MMM, there is some tooth decay noted Neck: supple, no lymphadenopathy, no thyromegaly, no masses, no bruit Heart: RRR, normal S1, S2, no murmurs Lungs: CTA bilaterally, no wheezes, rhonchi, or rales Pulses: 2+ radial pulses, 2+ pedal pulses, normal cap refill Ext: no edema Neuro: CN II through XII intact, nonfocal exam    Assessment: Encounter Diagnoses  Name Primary?   Nonintractable headache, unspecified chronicity pattern, unspecified headache type Yes   History of COVID-19    Moderate persistent asthma, unspecified whether complicated    Diabetes mellitus with complication    Hyperlipidemia, unspecified hyperlipidemia type    Microalbuminuria      Plan: I reviewed her recent office visit here in emergency department visit for headaches.  She has been given headache medicine by injection and cocktail at the emergency department.  Her headaches persisted even until yesterday when she called in here.  We had her take some additional oxycodone yesterday.  She does feel better today first time she has felt better since the headache started a few weeks ago.  I reviewed her recent head scan, head CT that was normal.  I reviewed labs from emergency department.  I saw you today for frequent headaches of recent starting in early April.  There are several things that can cause headaches including poor sleep, too much caffeine, skipping meals, certain foods can cause headaches, uncontrolled asthma, allergies, sleep apnea and other issues can cause headaches.  Your blood pressures have been a little elevated of late.  I need you to monitor your blood pressures every few days for the next week or 2 to make sure your pressures are not staying high.  If your blood pressures are staying  greater than 130/80 we may need to increase the blood pressure medicine that you take for kidney protection called losartan.  Continue your diabetes medicines and lets try to keep your blood sugar less than 130 fasting  I really need you to work on getting closer to 7 or 8 hours of sleep per night.  Watch TV at other times a day or try to have a straight cut off time for you go to bed with plenty of time to get 7 to 8 hours of sleep  Cut back some on caffeine as well  I am glad your headaches are better today  If you get another headache in the next day or 2 you can need to use the oxycodone you have or you can try samples the medicine called Nurtec that I gave you today.  Nurtec can be taken once in a day  for onset of bad headache  I want you to call back within the next week or 2 to give me an update on your blood pressures and your headache symptoms.  See your eye doctor and dentist soon   Stacey Simon was seen today for headache.  Diagnoses and all orders for this visit:  Nonintractable headache, unspecified chronicity pattern, unspecified headache type  History of COVID-19  Moderate persistent asthma, unspecified whether complicated  Diabetes mellitus with complication  Hyperlipidemia, unspecified hyperlipidemia type  Microalbuminuria    Follow up: call report 2 weeks

## 2022-08-27 NOTE — Patient Instructions (Signed)
I saw you today for frequent headaches of recent starting in early April.  There are several things that can cause headaches including poor sleep, too much caffeine, skipping meals, certain foods can cause headaches, uncontrolled asthma, allergies, sleep apnea and other issues can cause headaches.  Your blood pressures have been a little elevated of late.  I need you to monitor your blood pressures every few days for the next week or 2 to make sure your pressures are not staying high.  If your blood pressures are staying greater than 130/80 we may need to increase the blood pressure medicine that you take for kidney protection called losartan.  Continue your diabetes medicines and lets try to keep your blood sugar less than 130 fasting  I really need you to work on getting closer to 7 or 8 hours of sleep per night.  Watch TV at other times a day or try to have a straight cut off time for you go to bed with plenty of time to get 7 to 8 hours of sleep  Cut back some on caffeine as well  I am glad your headaches are better today  If you get another headache in the next day or 2 you can need to use the oxycodone you have or you can try samples the medicine called Nurtec that I gave you today.  Nurtec can be taken once in a day for onset of bad headache  I want you to call back within the next week or 2 to give me an update on your blood pressures and your headache symptoms.

## 2022-09-26 ENCOUNTER — Institutional Professional Consult (permissible substitution): Payer: BC Managed Care – PPO | Admitting: Plastic Surgery

## 2022-10-18 ENCOUNTER — Ambulatory Visit: Payer: BC Managed Care – PPO | Admitting: Neurology

## 2022-10-18 ENCOUNTER — Encounter: Payer: Self-pay | Admitting: Neurology

## 2022-10-18 VITALS — BP 148/75 | HR 75 | Ht 66.0 in | Wt 181.0 lb

## 2022-10-18 DIAGNOSIS — G44209 Tension-type headache, unspecified, not intractable: Secondary | ICD-10-CM

## 2022-10-18 NOTE — Patient Instructions (Signed)
Continue current medications  Follow up as needed

## 2022-10-18 NOTE — Progress Notes (Signed)
[facility]  PATIENT: Stacey Simon DOB: [birth year]  REQUESTING CLINICIAN: Tysinger, Kermit Balo, PA-C HISTORY FROM: Patient  REASON FOR VISIT: Headaches    HISTORICAL  CHIEF COMPLAINT:  Chief Complaint  Patient presents with   New Patient (Initial Visit)    Patient in room #12 and alone. Patient states she been having really bad headaches and she doesn't sleep well at night.    HISTORY OF PRESENT ILLNESS:  This is a 63 year old woman past medical history of hypertension, hyperlipidemia, asthma and insomnia who is presenting for new onset headache.  Patient reports that in the month of April she was experiencing constant throbbing headache.  Headaches were described as diffuse throbbing pain, Tylenol did not help.  She presented to the ED, had a head CT which showed no acute intracranial abnormality and she was given Toradol IV which helped with a headache.  Patient reports that headaches were gone for 12 hours, she was also recommended to take Aleve.  A week later she follow-up with her PCP who gave her Nurtec but she never used it.  She reports a week later her headaches resolved and she has been doing well since. She has chronic insomnia, but reports that she increase her water intake. 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    Headache History and Characteristics: Onset: Mont of April  Location: Diffuse  Quality: Throbbing  Intensity: 7-8/10 but now resolved  Duration: Days Migrainous Features: None   Aura: No  History of brain injury or tumor: No  Motion sickness: no Cardiac history: no  OTC: tylenol, codeine, Aleve  Prior prophylaxis: Propranolol: No  Verapamil:No TCA: No Topamax: No Depakote: No Effexor: No Cymbalta: No Neurontin:No  Prior abortives: Triptan: No Anti-emetic: No Steroids: No Ergotamine suppository: No    OTHER MEDICAL CONDITIONS: Hypertension, Hyperlipidemia, Insomnia and  Asthma   REVIEW OF SYSTEMS: Full 14 system review of systems performed and negative with exception of: As noted in the HPI   ALLERGIES: No Known Allergies  HOME MEDICATIONS: Outpatient Medications Prior to Visit  Medication Sig Dispense Refill   albuterol (VENTOLIN HFA) 108 (90 Base) MCG/ACT inhaler Inhale 2 puffs into the lungs every 6 (six) hours as needed for wheezing or shortness of breath. 8.5 each 2   Dulaglutide (TRULICITY) 0.75 MG/0.5ML SOPN INJECT 0.75 MG SUBCUTANEOUSLY ONE TIME PER WEEK 6 mL 3   fluticasone furoate-vilanterol (BREO ELLIPTA) 200-25 MCG/ACT AEPB INHALE 1 PUFF BY MOUTH EVERY DAY 60 each 5   glucose blood test strip Use as instructed 100 each 12   Lancets (ONETOUCH ULTRASOFT) lancets Use as instructed 100 each 12   losartan (COZAAR) 25 MG tablet Take 1 tablet (25 mg total) by mouth daily. 90 tablet 3   rosuvastatin (CRESTOR) 20 MG tablet Take 1 tablet (20 mg total) by mouth daily. TAKE 1 TABLET BY MOUTH EVERY DAY IN THE EVENING 90 tablet 2   Vitamin D, Ergocalciferol, (DRISDOL) 1.25 MG (50000 UNIT) CAPS capsule Take 1 capsule (50,000 Units total) by mouth every 7 (seven) days. 12 capsule 0   Continuous Blood Gluc Sensor (FREESTYLE LIBRE 3 SENSOR) MISC 1 each by Does not apply route every 14 (fourteen) days. Place 1 sensor on the skin every 14 days. Use to check glucose continuously (Patient not taking: Reported on 08/27/2022) 2 each 11   oxyCODONE-acetaminophen (PERCOCET) 5-325 MG tablet Take 1 tablet by mouth every 6 (six) hours as needed for severe pain. (Patient  not taking: Reported on 08/27/2022) 12 tablet 0   No facility-administered medications prior to visit.    PAST MEDICAL HISTORY: Past Medical History:  Diagnosis Date   Allergy    Anemia    in college   Asthma    a few prior hospitalizations, last 1992   Diabetes mellitus without complication (HCC) 08/2015   Hyperlipidemia    Insomnia    Low HDL (under 40) 08/2015   Microalbuminuria     PAST  SURGICAL HISTORY: Past Surgical History:  Procedure Laterality Date   CESAREAN SECTION     COLONOSCOPY  09/2015   polyps, Dr. Viviann Spare Armbruster   COLONOSCOPY  05/2016   [facility], due repeat 09/2018   CYST EXCISION     left volar foot   KNEE ARTHROSCOPY W/ MENISCAL REPAIR  10/2017   left   UMBILICAL HERNIA REPAIR      FAMILY HISTORY: Family History  Problem Relation Age of Onset   Asthma Mother    Hypertension Mother    Asthma Sister    Asthma Daughter    Cancer Maternal Aunt        breast   Hypertension Maternal Grandmother    Hypertension Paternal Grandmother    Hypertension Paternal Grandfather    Stroke Paternal Grandfather    Asthma Brother    Asthma Son    Colon cancer Neg Hx    Breast cancer Neg Hx     SOCIAL HISTORY: Social History   Socioeconomic History   Marital status: Married    Spouse name: Not on file   Number of children: Not on file   Years of education: Not on file   Highest education level: Not on file  Occupational History   Not on file  Tobacco Use   Smoking status: Never   Smokeless tobacco: Never  Vaping Use   Vaping Use: Never used  Substance and Sexual Activity   Alcohol use: No    Alcohol/week: 0.0 standard drinks of alcohol   Drug use: No   Sexual activity: Not on file  Other Topics Concern   Not on file  Social History Narrative   Married, has 2 children, retired from [employer].   Teaching at 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  Exercise - walk, and exercise with her students.   Has 2nd degree black belt in tae kwon do.   From [place] originally.  09/2020   Social Determinants of Health   Financial Resource Strain: Not on file  Food Insecurity: Not on file  Transportation Needs: Not on file  Physical Activity: Not on file  Stress: Not on file  Social Connections: Not on file  Intimate Partner Violence: Not on file    PHYSICAL EXAM  GENERAL  EXAM/CONSTITUTIONAL: Vitals:  Vitals:   10/18/22 0804  BP: (!) 148/75  Pulse: 75  Weight: 181 lb (82.1 kg)  Height: 5\' 6"  (1.676 m)   Body mass index is 29.21 kg/m. Wt Readings from Last 3 Encounters:  10/18/22 181 lb (82.1 kg)  08/27/22 180 lb 12.8 oz (82 kg)  08/23/22 178 lb (80.7 kg)   Patient is in no distress; well developed, nourished and groomed; neck is supple  MUSCULOSKELETAL: Gait, strength, tone, movements noted in Neurologic exam below  NEUROLOGIC: MENTAL STATUS:      No data to display         awake, alert, oriented to person, place and time  recent and remote memory intact normal attention and concentration language fluent, comprehension intact, naming intact fund of knowledge appropriate  CRANIAL NERVE:  2nd - no papilledema or hemorrhages on fundoscopic exam 2nd, 3rd, 4th, 6th - pupils equal and reactive to light, visual fields full to confrontation, extraocular muscles intact, no nystagmus 5th - facial sensation symmetric 7th - facial strength symmetric 8th - hearing intact 9th - palate elevates symmetrically, uvula midline 11th - shoulder shrug symmetric 12th - tongue protrusion midline  MOTOR:  normal bulk and tone, full strength in the BUE, BLE  SENSORY:  normal and symmetric to light touch, pinprick  COORDINATION:  finger-nose-finger, fine finger movements normal  GAIT/STATION:  normal    DIAGNOSTIC DATA (LABS, IMAGING, TESTING) - I reviewed patient records, labs, notes, testing and imaging myself where available.  Lab Results  Component Value Date   WBC 8.5 08/24/2022   HGB 11.7 (L) 08/24/2022   HCT 36.1 08/24/2022   MCV 85.3 08/24/2022   PLT 291 08/24/2022      Component Value Date/Time   NA 138 08/24/2022 0003   NA 143 01/09/2022 0000   K 4.0 08/24/2022 0003   CL 107 08/24/2022 0003   CO2 25 08/24/2022 0003   GLUCOSE 166 (H) 08/24/2022 0003   BUN 12 08/24/2022 0003   BUN 12 01/09/2022 0000   CREATININE 0.82  08/24/2022 0003   CREATININE 0.88 02/19/2017 1651   CALCIUM 9.0 08/24/2022 0003   PROT 7.8 01/09/2022 0000   ALBUMIN 4.7 01/09/2022 0000   AST 18 01/09/2022 0000   ALT 16 01/09/2022 0000   ALKPHOS 93 01/09/2022 0000   BILITOT 0.4 01/09/2022 0000   GFRNONAA >60 08/24/2022 0003   GFRAA 91 01/18/2020 1355   Lab Results  Component Value Date   CHOL 202 (H) 01/09/2022   HDL 41 01/09/2022   LDLCALC 124 (H) 01/09/2022   TRIG 210 (H) 01/09/2022   CHOLHDL 4.9 (H) 01/09/2022   Lab Results  Component Value Date   HGBA1C 6.7 (H) 06/25/2022   No results found for: "VITAMINB12" Lab Results  Component Value Date   TSH 1.590 05/26/2017    Head CT 08/24/2022 Negative head CT. No intracranial mass, hemorrhage or edema.     ASSESSMENT AND PLAN  62 y.o. year old female with hypertension, hyperlipidemia, asthma and insomnia who is presenting with headaches described as throbbing constant headache.  There were no migrainous features.  Headache likely tension type.  She reports that her headaches are now resolved, she has not had any episode after her visit to the hospital.  At that time she was given Toradol and was recommended to take Aleve.  Plan for now is for patient to increase her water intake, make sure that she get at least 8 hours sleep at night and also exercise.  If she developed new headaches in the future to please contact us.  Continue to follow with PCP and return as needed.   1. Tension headache     Patient Instructions  Continue current medications  Follow up as needed    No orders of the defined types were placed in this encounter.   No orders of the defined types were placed in this encounter.   Return if symptoms worsen or fail to improve.    Windell Norfolk, MD 10/18/2022, 8:43 AM  [facility] [address], [address] [place], [place] [address] [phone]

## 2022-11-13 ENCOUNTER — Other Ambulatory Visit: Payer: Self-pay | Admitting: Medical

## 2022-11-13 ENCOUNTER — Telehealth: Payer: Self-pay | Admitting: Internal Medicine

## 2022-11-13 MED ORDER — DIAZEPAM 2 MG PO TABS: 2.0000 mg | ORAL_TABLET | Freq: Three times a day (TID) | ORAL | 0 refills | Status: DC | PRN

## 2022-11-13 NOTE — Telephone Encounter (Signed)
Pt is leaving to go out of country and pt has hard time flying and would like something to relax her and make her sleep. Can you send something to pharmacy

## 2022-11-15 NOTE — Telephone Encounter (Signed)
Pt was notified of results

## 2023-01-15 ENCOUNTER — Encounter: Payer: BC Managed Care – PPO | Admitting: Medical

## 2023-01-17 ENCOUNTER — Telehealth: Payer: Self-pay | Admitting: Medical

## 2023-01-17 NOTE — Telephone Encounter (Signed)
She no showed the other day.  I do not think this is the first time she is no-show to physical.  See what the status is of prior warnings

## 2023-03-28 ENCOUNTER — Encounter: Payer: Self-pay | Admitting: Medical

## 2023-04-01 ENCOUNTER — Other Ambulatory Visit: Payer: Self-pay | Admitting: Medical

## 2023-04-01 ENCOUNTER — Telehealth: Payer: Self-pay | Admitting: Medical

## 2023-04-01 MED ORDER — ALBUTEROL SULFATE HFA 108 (90 BASE) MCG/ACT IN AERS: 2.0000 | INHALATION_SPRAY | Freq: Four times a day (QID) | RESPIRATORY_TRACT | 1 refills | Status: DC | PRN

## 2023-04-01 MED ORDER — ALBUTEROL SULFATE HFA 108 (90 BASE) MCG/ACT IN AERS: 2.0000 | INHALATION_SPRAY | Freq: Four times a day (QID) | RESPIRATORY_TRACT | 2 refills | Status: DC | PRN

## 2023-04-01 NOTE — Telephone Encounter (Signed)
Pt requesting a refill on albuterol (VENTOLIN HFA) 108 (90 Base) MCG/ACT inhaler to  [id number] - [place], [place] - [address]

## 2023-04-02 ENCOUNTER — Encounter: Payer: Self-pay | Admitting: Internal Medicine

## 2023-04-03 ENCOUNTER — Other Ambulatory Visit: Payer: Self-pay | Admitting: Medical

## 2023-04-03 ENCOUNTER — Encounter: Payer: Self-pay | Admitting: Internal Medicine

## 2023-04-28 ENCOUNTER — Telehealth: Payer: Self-pay | Admitting: Medical

## 2023-04-28 NOTE — Telephone Encounter (Signed)
Pt states you gave her some meds before when she traveled before to [place] and she is going to [place] later this week and wants to see if you can prescribe again for her trip     [facility] [address]

## 2023-04-29 ENCOUNTER — Other Ambulatory Visit: Payer: Self-pay | Admitting: Medical

## 2023-04-29 MED ORDER — DIAZEPAM 2 MG PO TABS: 2.0000 mg | ORAL_TABLET | Freq: Three times a day (TID) | ORAL | 0 refills | Status: DC | PRN

## 2023-04-29 NOTE — Telephone Encounter (Signed)
Pt would like valium refilled for her flight to [place]

## 2023-05-06 ENCOUNTER — Other Ambulatory Visit: Payer: Self-pay | Admitting: Medical

## 2023-05-10 ENCOUNTER — Other Ambulatory Visit: Payer: Self-pay | Admitting: Medical

## 2023-05-12 ENCOUNTER — Telehealth: Payer: Self-pay | Admitting: Internal Medicine

## 2023-05-12 NOTE — Telephone Encounter (Signed)
Pt is due for an appointment. Please call pt to schedule.

## 2023-05-12 NOTE — Telephone Encounter (Signed)
Will have front office call pt to schedule a visit

## 2023-05-13 NOTE — Telephone Encounter (Signed)
 Patient was notified that she is overdue and needs to schedule. She said she would look at her calendar and call us back

## 2023-05-16 ENCOUNTER — Ambulatory Visit: Payer: 59 | Admitting: Medical

## 2023-05-16 ENCOUNTER — Encounter: Payer: Self-pay | Admitting: Medical

## 2023-05-16 VITALS — BP 154/82 | HR 87 | Temp 98.3°F | Wt 184.4 lb

## 2023-05-16 DIAGNOSIS — R82998 Other abnormal findings in urine: Secondary | ICD-10-CM

## 2023-05-16 DIAGNOSIS — R300 Dysuria: Secondary | ICD-10-CM | POA: Diagnosis not present

## 2023-05-16 LAB — POCT URINALYSIS DIP (PROADVANTAGE DEVICE)
Bilirubin, UA: NEGATIVE
Glucose, UA: NEGATIVE mg/dL
Ketones, POC UA: NEGATIVE mg/dL
Nitrite, UA: NEGATIVE
Protein Ur, POC: 30 mg/dL — AB
Specific Gravity, Urine: 1.015
Urobilinogen, Ur: 0.2
pH, UA: 6 (ref 5.0–8.0)

## 2023-05-16 MED ORDER — SULFAMETHOXAZOLE-TRIMETHOPRIM 800-160 MG PO TABS: 1.0000 | ORAL_TABLET | Freq: Two times a day (BID) | ORAL | 0 refills | Status: DC

## 2023-05-16 NOTE — Addendum Note (Signed)
 Addended byLawernce Pitts on: 05/16/2023 02:19 PM   Modules accepted: Orders

## 2023-05-16 NOTE — Progress Notes (Signed)
 Subjective:  Stacey Simon is a 63 y.o. female who presents for Chief Complaint  Patient presents with   Hematuria    Yesterday morning. Sexual activity a couple weeks ago.      Here for blood in urine.  Saw this yesterday morning in the toilet bowl.  Just saw it once.   Urine has been darker than usual.  Has some urinary burning, not really a lot of frequency.  Having some urgency.  No fever.  No body aches or chills.  No belly pain or back pain.   No vaginal discharge.   Urine less dark today.     No prior kidney stone.  No current pain.    No other aggravating or relieving factors.    No other c/o.  Past Medical History:  Diagnosis Date   Allergy     Anemia    in college   Asthma    a few prior hospitalizations, last 1992   Diabetes mellitus without complication (HCC) 08/2015   Hyperlipidemia    Insomnia    Low HDL (under 40) 08/2015   Microalbuminuria    Current Outpatient Medications on File Prior to Visit  Medication Sig Dispense Refill   albuterol  (VENTOLIN  HFA) 108 (90 Base) MCG/ACT inhaler Inhale 2 puffs into the lungs every 6 (six) hours as needed for wheezing or shortness of breath. 8.5 each 1   albuterol  (VENTOLIN  HFA) 108 (90 Base) MCG/ACT inhaler Inhale 2 puffs into the lungs every 6 (six) hours as needed for wheezing or shortness of breath. 8 g 2   fluticasone  furoate-vilanterol (BREO ELLIPTA ) 200-25 MCG/ACT AEPB INHALE 1 PUFF BY MOUTH EVERY DAY 60 each 5   losartan  (COZAAR ) 25 MG tablet TAKE 1 TABLET (25 MG TOTAL) BY MOUTH DAILY. 30 tablet 0   rosuvastatin  (CRESTOR ) 20 MG tablet Take 1 tablet (20 mg total) by mouth daily. TAKE 1 TABLET BY MOUTH EVERY DAY IN THE EVENING 90 tablet 2   Continuous Blood Gluc Sensor (FREESTYLE LIBRE 3 SENSOR) MISC 1 each by Does not apply route every 14 (fourteen) days. Place 1 sensor on the skin every 14 days. Use to check glucose continuously (Patient not taking: Reported on 05/16/2023) 2 each 11   diazepam  (VALIUM ) 2 MG  tablet Take 1 tablet (2 mg total) by mouth every 8 (eight) hours as needed for anxiety. Flight anxiety (Patient not taking: Reported on 05/16/2023) 5 tablet 0   Dulaglutide  (TRULICITY ) 0.75 MG/0.5ML SOPN INJECT 0.75 MG SUBCUTANEOUSLY ONE TIME PER WEEK (Patient not taking: Reported on 05/16/2023) 6 mL 3   glucose blood test strip Use as instructed (Patient not taking: Reported on 05/16/2023) 100 each 12   Lancets (ONETOUCH ULTRASOFT) lancets Use as instructed (Patient not taking: Reported on 05/16/2023) 100 each 12   oxyCODONE -acetaminophen  (PERCOCET) 5-325 MG tablet Take 1 tablet by mouth every 6 (six) hours as needed for severe pain. (Patient not taking: Reported on 05/16/2023) 12 tablet 0   Vitamin D , Ergocalciferol , (DRISDOL ) 1.25 MG (50000 UNIT) CAPS capsule Take 1 capsule (50,000 Units total) by mouth every 7 (seven) days. (Patient not taking: Reported on 05/16/2023) 12 capsule 0   [DISCONTINUED] levalbuterol  (XOPENEX  HFA) 45 MCG/ACT inhaler Inhale 2 puffs into the lungs every 6 (six) hours as needed for wheezing. (Patient not taking: Reported on 04/25/2019) 1 Inhaler 12   [DISCONTINUED] metFORMIN  (GLUCOPHAGE ) 500 MG tablet TAKE 1 TABLET BY MOUTH EVERY DAY WITH BREAKFAST (Patient not taking: Reported on 04/19/2019) 90 tablet 3  No current facility-administered medications on file prior to visit.    The following portions of the patient's history were reviewed and updated as appropriate: allergies, current medications, past family history, past medical history, past social history, past surgical history and problem list.  ROS Otherwise as in subjective above   Objective: BP (!) 154/82   Pulse 87   Temp 98.3 F (36.8 C) (Oral)   Wt 184 lb 6.4 oz (83.6 kg)   SpO2 99%   BMI 29.76 kg/m   General appearance: alert, no distress, well developed, well nourished Abdomen: +bs, soft, non tender, non distended, no masses, no hepatomegaly, no splenomegaly Back: nontender Pulses: 2+ radial pulses, 2+ pedal  pulses, normal cap refill Ext: no edema   Assessment: Encounter Diagnoses  Name Primary?   Dysuria Yes   Dark urine      Plan: We discussed symptoms and concerns and possible differential.  I suspect urinary tract infection.  Begin Bactrim  antibiotic, good hydration.  Urine sent for culture.  If much worse over the weekend get reevaluated.  Otherwise follow-up pending culture results.   [name] was seen today for hematuria.  Diagnoses and all orders for this visit:  Dysuria -     Urine Culture  Dark urine -     Urine Culture  Other orders -     sulfamethoxazole -trimethoprim  (BACTRIM  DS) 800-160 MG tablet; Take 1 tablet by mouth 2 (two) times daily.    Follow up: pending culture

## 2023-05-20 LAB — URINE CULTURE

## 2023-05-20 NOTE — Progress Notes (Signed)
The urine culture was positive for infection, and the bacteria is sensitive to bactrim.   As long as you are improving, finish the antibiotic as it should work.  If not improving, let me know.

## 2023-05-21 ENCOUNTER — Other Ambulatory Visit: Payer: Self-pay | Admitting: Medical

## 2023-06-04 ENCOUNTER — Other Ambulatory Visit: Payer: Self-pay | Admitting: Medical

## 2023-06-07 ENCOUNTER — Other Ambulatory Visit: Payer: Self-pay | Admitting: Medical

## 2023-06-09 NOTE — Telephone Encounter (Signed)
Pt is scheduled 2/27

## 2023-06-24 ENCOUNTER — Other Ambulatory Visit: Payer: Self-pay | Admitting: Medical

## 2023-06-24 MED ORDER — FREESTYLE LIBRE 3 SENSOR MISC: 1.0000 | 11 refills | Status: DC

## 2023-06-24 MED ORDER — LOSARTAN POTASSIUM 25 MG PO TABS: 25.0000 mg | ORAL_TABLET | Freq: Every day | ORAL | 0 refills | Status: DC

## 2023-06-25 ENCOUNTER — Telehealth: Payer: 59 | Admitting: Medical

## 2023-06-25 VITALS — Wt 183.0 lb

## 2023-06-25 DIAGNOSIS — R059 Cough, unspecified: Secondary | ICD-10-CM | POA: Diagnosis not present

## 2023-06-25 DIAGNOSIS — J029 Acute pharyngitis, unspecified: Secondary | ICD-10-CM

## 2023-06-25 DIAGNOSIS — R519 Headache, unspecified: Secondary | ICD-10-CM

## 2023-06-25 DIAGNOSIS — R6889 Other general symptoms and signs: Secondary | ICD-10-CM

## 2023-06-25 DIAGNOSIS — R599 Enlarged lymph nodes, unspecified: Secondary | ICD-10-CM

## 2023-06-25 MED ORDER — AMOXICILLIN 500 MG PO CAPS: 500.0000 mg | ORAL_CAPSULE | Freq: Three times a day (TID) | ORAL | 0 refills | Status: AC

## 2023-06-25 MED ORDER — IBUPROFEN 600 MG PO TABS: 600.0000 mg | ORAL_TABLET | Freq: Three times a day (TID) | ORAL | 0 refills | Status: AC | PRN

## 2023-06-25 NOTE — Progress Notes (Signed)
Subjective:     Patient ID: Stacey Simon, female   DOB: 1961-01-13, 63 y.o.   MRN: [id number]  This visit type was conducted due to national recommendations for restrictions regarding the COVID-19 Pandemic (e.g. social distancing) in an effort to limit this patient's exposure and mitigate transmission in our community.  Due to their co-morbid illnesses, this patient is at least at moderate risk for complications without adequate follow up.  This format is felt to be most appropriate for this patient at this time.    Documentation for virtual audio and video telecommunications through [facility] encounter:  The patient was located at home. The provider was located in the office. The patient did consent to this visit and is aware of possible charges through their insurance for this visit.  The other persons participating in this telemedicine service were none. Time spent on call was 20 minutes and in review of previous records 20 minutes total.  This virtual service is not related to other E/M service within previous 7 days.   HPI Chief Complaint  Patient presents with   sick    Sick- glands are swollen, hurts to swallow but not a sore throat. Head cold, sneezing. Symptoms going on since Saturday. Covid test negative   Visit for illness.  Has been sick since Saturday 4 days ago.  She notes glands are swollen, sore throat, headache, sneezing, some coughing.  Using hydration.  Using ibuprofen OTC.  Main concern is pain and fullness in throat.  Had a fever but that improved.   Nauseated 4 days ago but not now.  Has had aches.  Has been using some tylenol cold and flu.  I did a virtual visit with her husband yesterday who has had flu contacts and had flu like symptoms.  She started Tamiflu on him yesterday.  They have both been around people with flu positive in the past week.   No other aggravating or relieving factors. No other complaint.  Past Medical History:   Diagnosis Date   Allergy    Anemia    in college   Asthma    a few prior hospitalizations, last 1992   Diabetes mellitus without complication (HCC) 08/2015   Hyperlipidemia    Insomnia    Low HDL (under 40) 08/2015   Microalbuminuria    Current Outpatient Medications on File Prior to Visit  Medication Sig Dispense Refill   albuterol (VENTOLIN HFA) 108 (90 Base) MCG/ACT inhaler Inhale 2 puffs into the lungs every 6 (six) hours as needed for wheezing or shortness of breath. 8.5 each 1   losartan (COZAAR) 25 MG tablet Take 1 tablet (25 mg total) by mouth daily. 30 tablet 0   rosuvastatin (CRESTOR) 20 MG tablet TAKE 1 TABLET (20 MG TOTAL) BY MOUTH DAILY IN THE EVENING 90 tablet 0   albuterol (VENTOLIN HFA) 108 (90 Base) MCG/ACT inhaler Inhale 2 puffs into the lungs every 6 (six) hours as needed for wheezing or shortness of breath. 8 g 2   Continuous Glucose Sensor (FREESTYLE LIBRE 3 SENSOR) MISC 1 each by Does not apply route every 14 (fourteen) days. Place 1 sensor on the skin every 14 days. Use to check glucose continuously 2 each 11   glucose blood test strip Use as instructed (Patient not taking: Reported on 05/16/2023) 100 each 12   Lancets (ONETOUCH ULTRASOFT) lancets Use as instructed (Patient not taking: Reported on 05/16/2023) 100 each 12   [DISCONTINUED] levalbuterol (XOPENEX HFA) 45 MCG/ACT inhaler Inhale  2 puffs into the lungs every 6 (six) hours as needed for wheezing. (Patient not taking: Reported on 04/25/2019) 1 Inhaler 12   [DISCONTINUED] metFORMIN (GLUCOPHAGE) 500 MG tablet TAKE 1 TABLET BY MOUTH EVERY DAY WITH BREAKFAST (Patient not taking: Reported on 04/19/2019) 90 tablet 3   No current facility-administered medications on file prior to visit.     Review of Systems As in subjective    Objective:   Physical Exam Due to coronavirus pandemic stay at home measures, patient visit was virtual and they were not examined in person.   Wt 183 lb (83 kg)   BMI 29.54 kg/m     Gen: wd, wn, nad Voice seems slightly muffled Despite numerous attempts I could not get a really good look at the back of her throat through the phone despite flashlights and bright lining      Assessment:     Encounter Diagnoses  Name Primary?   Swollen gland Yes   Sore throat    Flu-like symptoms        Plan:     Discussed symptoms and concerns.  She and her husband have been around flu positive cases and her husband is being treated for flu right now.  She is on day 4 of symptoms.  Advise she continue with good hydration, salt water gargles multiple times a day, warm fluids such as hot tea.  Begin ibuprofen below.  Begin back on the over-the-counter Tylenol Cold and flu she has at home for a couple more days.  Given the potential for strep or bacterial infection given her symptoms and pain although I start her on a round of antibiotic as below.  If not much improved within the next 72 hours then call or recheck  Stacey Simon was seen today for sick.  Diagnoses and all orders for this visit:  Swollen gland  Sore throat  Flu-like symptoms    F/u prn

## 2023-07-10 ENCOUNTER — Ambulatory Visit: Payer: 59 | Admitting: Medical

## 2023-07-10 ENCOUNTER — Encounter: Payer: 59 | Admitting: Medical

## 2023-07-10 ENCOUNTER — Other Ambulatory Visit (HOSPITAL_COMMUNITY)
Admission: RE | Admit: 2023-07-10 | Discharge: 2023-07-10 | Disposition: A | Discharge status: Home / Self Care | Source: Ambulatory Visit | Attending: Medical | Admitting: Medical

## 2023-07-10 VITALS — BP 130/80 | HR 71 | Wt 182.0 lb

## 2023-07-10 DIAGNOSIS — R809 Proteinuria, unspecified: Secondary | ICD-10-CM | POA: Diagnosis not present

## 2023-07-10 DIAGNOSIS — E2839 Other primary ovarian failure: Secondary | ICD-10-CM

## 2023-07-10 DIAGNOSIS — Z0000 Encounter for general adult medical examination without abnormal findings: Secondary | ICD-10-CM

## 2023-07-10 DIAGNOSIS — Z23 Encounter for immunization: Secondary | ICD-10-CM

## 2023-07-10 DIAGNOSIS — Z124 Encounter for screening for malignant neoplasm of cervix: Secondary | ICD-10-CM

## 2023-07-10 DIAGNOSIS — L84 Corns and callosities: Secondary | ICD-10-CM | POA: Diagnosis not present

## 2023-07-10 DIAGNOSIS — R32 Unspecified urinary incontinence: Secondary | ICD-10-CM

## 2023-07-10 DIAGNOSIS — J4540 Moderate persistent asthma, uncomplicated: Secondary | ICD-10-CM

## 2023-07-10 DIAGNOSIS — E785 Hyperlipidemia, unspecified: Secondary | ICD-10-CM

## 2023-07-10 DIAGNOSIS — Z1231 Encounter for screening mammogram for malignant neoplasm of breast: Secondary | ICD-10-CM | POA: Diagnosis not present

## 2023-07-10 DIAGNOSIS — Z7185 Encounter for immunization safety counseling: Secondary | ICD-10-CM

## 2023-07-10 DIAGNOSIS — Z860100 Personal history of colon polyps, unspecified: Secondary | ICD-10-CM

## 2023-07-10 DIAGNOSIS — E559 Vitamin D deficiency, unspecified: Secondary | ICD-10-CM

## 2023-07-10 DIAGNOSIS — M21619 Bunion of unspecified foot: Secondary | ICD-10-CM

## 2023-07-10 DIAGNOSIS — E118 Type 2 diabetes mellitus with unspecified complications: Secondary | ICD-10-CM | POA: Diagnosis not present

## 2023-07-10 DIAGNOSIS — Z780 Asymptomatic menopausal state: Secondary | ICD-10-CM

## 2023-07-10 LAB — LIPID PANEL

## 2023-07-10 NOTE — Progress Notes (Signed)
 Subjective:   HPI  Stacey Simon is a 63 y.o. female who presents for Chief Complaint  Patient presents with   Annual Exam    Cpe, no concerns    Patient Care Team: Pamelyn Bancroft, Cleda Mccreedy as PCP - General (Family Medicine) Dr. Gala Lewandowsky, podiatry Dr. Jacobo Forest, GI    Concerns: History of diabetes-currently diet controlled, no particular issues  Hyperlipidemia-compliant with statin without complaint  Asthma-no recent concerns  She has been having some urinary issues where she feels like she has to urinate with an urgency.  He is send she feels the urge has to go right then to urinate.  No bowel issues.  No blood in urine.  No odor in urine.  No burning with urination.  Needs a new glucometer he would like to use the freestyle libre again.  Reviewed their medical, surgical, family, social, medication, and allergy history and updated chart as appropriate.  No Known Allergies  Past Medical History:  Diagnosis Date   Allergy    Anemia    in college   Asthma    a few prior hospitalizations, last 1992   Diabetes mellitus without complication (HCC) 08/2015   Hyperlipidemia    Insomnia    Low HDL (under 40) 08/2015   Microalbuminuria     Current Outpatient Medications on File Prior to Visit  Medication Sig Dispense Refill   albuterol (VENTOLIN HFA) 108 (90 Base) MCG/ACT inhaler Inhale 2 puffs into the lungs every 6 (six) hours as needed for wheezing or shortness of breath. 8.5 each 1   albuterol (VENTOLIN HFA) 108 (90 Base) MCG/ACT inhaler Inhale 2 puffs into the lungs every 6 (six) hours as needed for wheezing or shortness of breath. 8 g 2   Continuous Glucose Sensor (FREESTYLE LIBRE 3 SENSOR) MISC 1 each by Does not apply route every 14 (fourteen) days. Place 1 sensor on the skin every 14 days. Use to check glucose continuously 2 each 11   glucose blood test strip Use as instructed (Patient not taking: Reported on 05/16/2023) 100 each 12   ibuprofen  (ADVIL) 600 MG tablet Take 1 tablet (600 mg total) by mouth every 8 (eight) hours as needed. 30 tablet 0   Lancets (ONETOUCH ULTRASOFT) lancets Use as instructed (Patient not taking: Reported on 05/16/2023) 100 each 12   losartan (COZAAR) 25 MG tablet Take 1 tablet (25 mg total) by mouth daily. 30 tablet 0   rosuvastatin (CRESTOR) 20 MG tablet TAKE 1 TABLET (20 MG TOTAL) BY MOUTH DAILY IN THE EVENING 90 tablet 0   [DISCONTINUED] levalbuterol (XOPENEX HFA) 45 MCG/ACT inhaler Inhale 2 puffs into the lungs every 6 (six) hours as needed for wheezing. (Patient not taking: Reported on 04/25/2019) 1 Inhaler 12   [DISCONTINUED] metFORMIN (GLUCOPHAGE) 500 MG tablet TAKE 1 TABLET BY MOUTH EVERY DAY WITH BREAKFAST (Patient not taking: Reported on 04/19/2019) 90 tablet 3   No current facility-administered medications on file prior to visit.      Current Outpatient Medications:    albuterol (VENTOLIN HFA) 108 (90 Base) MCG/ACT inhaler, Inhale 2 puffs into the lungs every 6 (six) hours as needed for wheezing or shortness of breath., Disp: 8.5 each, Rfl: 1   albuterol (VENTOLIN HFA) 108 (90 Base) MCG/ACT inhaler, Inhale 2 puffs into the lungs every 6 (six) hours as needed for wheezing or shortness of breath., Disp: 8 g, Rfl: 2   Continuous Glucose Sensor (FREESTYLE LIBRE 3 SENSOR) MISC, 1 each by Does  not apply route every 14 (fourteen) days. Place 1 sensor on the skin every 14 days. Use to check glucose continuously, Disp: 2 each, Rfl: 11   glucose blood test strip, Use as instructed (Patient not taking: Reported on 05/16/2023), Disp: 100 each, Rfl: 12   ibuprofen (ADVIL) 600 MG tablet, Take 1 tablet (600 mg total) by mouth every 8 (eight) hours as needed., Disp: 30 tablet, Rfl: 0   Lancets (ONETOUCH ULTRASOFT) lancets, Use as instructed (Patient not taking: Reported on 05/16/2023), Disp: 100 each, Rfl: 12   losartan (COZAAR) 25 MG tablet, Take 1 tablet (25 mg total) by mouth daily., Disp: 30 tablet, Rfl: 0    rosuvastatin (CRESTOR) 20 MG tablet, TAKE 1 TABLET (20 MG TOTAL) BY MOUTH DAILY IN THE EVENING, Disp: 90 tablet, Rfl: 0  Family History  Problem Relation Age of Onset   Asthma Mother    Hypertension Mother    Asthma Sister    Asthma Daughter    Cancer Maternal Aunt        breast   Hypertension Maternal Grandmother    Hypertension Paternal Grandmother    Hypertension Paternal Grandfather    Stroke Paternal Grandfather    Asthma Brother    Asthma Son    Colon cancer Neg Hx    Breast cancer Neg Hx     Past Surgical History:  Procedure Laterality Date   CESAREAN SECTION     COLONOSCOPY  09/2015   polyps, Dr. Viviann Spare Armbruster   COLONOSCOPY  05/2016   [facility], due repeat 09/2018   CYST EXCISION     left volar foot   KNEE ARTHROSCOPY W/ MENISCAL REPAIR  10/2017   left   UMBILICAL HERNIA REPAIR      Review of Systems  Constitutional:  Negative for chills, fever, malaise/fatigue and weight loss.  HENT:  Negative for congestion, ear pain, hearing loss, sore throat and tinnitus.   Eyes:  Negative for blurred vision, pain and redness.  Respiratory:  Negative for cough, hemoptysis and shortness of breath.   Cardiovascular:  Negative for chest pain, palpitations, orthopnea, claudication and leg swelling.  Gastrointestinal:  Negative for abdominal pain, blood in stool, constipation, diarrhea, nausea and vomiting.  Genitourinary:  Negative for dysuria, flank pain, frequency, hematuria and urgency.       Incontinence occasionally, has to go right when she feels the urge  Musculoskeletal:  Negative for falls, joint pain and myalgias.  Skin:  Negative for itching and rash.       Corns and callus of feet, tender on right 2nd toe and bottom of left foot  Neurological:  Negative for dizziness, tingling, speech change, weakness and headaches.  Endo/Heme/Allergies:  Negative for polydipsia. Does not bruise/bleed easily.  Psychiatric/Behavioral:  Negative for depression and memory loss.  The patient is not nervous/anxious and does not have insomnia.         Objective:  BP 130/80   Pulse 71   Wt 182 lb (82.6 kg)   BMI 29.38 kg/m   General appearance: alert, no distress, WD/WN, African American female Skin: see foot exam , otherwise unremarkable HEENT: normocephalic, conjunctiva/corneas normal, sclerae anicteric, PERRLA, EOMi, nares patent, no discharge or erythema, pharynx normal Oral cavity: MMM, tongue normal, teeth normal Neck: supple, no lymphadenopathy, no thyromegaly, no masses, normal ROM, no bruits Chest: non tender, normal shape and expansion Heart: RRR, normal S1, S2, no murmurs Lungs: CTA bilaterally, no wheezes, rhonchi, or rales Abdomen: +bs, soft, non tender, non distended, no  masses, no hepatomegaly, no splenomegaly, no bruits Back: non tender, normal ROM, no scoliosis Musculoskeletal: upper extremities non tender, no obvious deformity, normal ROM throughout, lower extremities non tender, no obvious deformity, normal ROM throughout Extremities: no edema, no cyanosis, no clubbing Pulses: 2+ symmetric, upper and lower extremities, normal cap refill Neurological: alert, oriented x 3, CN2-12 intact, strength normal upper extremities and lower extremities, sensation normal throughout, DTRs 2+ throughout, no cerebellar signs, gait normal Psychiatric: normal affect, behavior normal, pleasant   Breast: nontender, no masses or lumps, no skin changes, no nipple discharge or inversion, no axillary lymphadenopathy Gyn: Normal external genitalia without lesions, vagina with normal mucosa, cervix without lesions, no cervical motion tenderness, no abnormal vaginal discharge.  Uterus and adnexa not enlarged, nontender, no masses.  Pap performed.  Exam chaperoned by nurse.   Diabetic Foot Exam - Simple   Simple Foot Form Diabetic Foot exam was performed with the following findings: Yes 07/10/2023  4:07 PM  Visual Inspection See comments: Yes Sensation Testing Intact  to touch and monofilament testing bilaterally: Yes Pulse Check Posterior Tibialis and Dorsalis pulse intact bilaterally: Yes Comments Hypertrophic nails noted particular the great toes bilaterally, there is a tender lesion which appears to be a corn on the medial side of her second toe on the right foot distal phalanx, there is a tender corn on the mom of the left lower foot, bunion is noted of great toes       Assessment and Plan :   Encounter Diagnoses  Name Primary?   Routine general medical examination at a health care facility Yes   Vaccine counseling    Screening for cervical cancer    Pre-ulcerative corn or callous    Post-menopausal    Microalbuminuria    Moderate persistent asthma, unspecified whether complicated    Bunion of great toe    Callus of foot    Diabetes mellitus with complication (HCC)    Estrogen deficiency    Hyperlipidemia, unspecified hyperlipidemia type    History of colonic polyps    Vitamin D deficiency    Screening mammogram for breast cancer    Urinary incontinence, unspecified type      This visit was a preventative care visit, also known as wellness visit or routine physical.   Topics typically include healthy lifestyle, diet, exercise, preventative care, vaccinations, sick and well care, proper use of emergency dept and after hours care, as well as other concerns.    Separate significant issues discussed: Diabetes-diet controlled currently.  Updated labs today   microalbuminuria-updated labs today.  Continue losartan 25 mg daily  Hyperlipidemia-continue rosuvastatin Crestor 20 mg daily  Asthma-continue albuterol as needed.  Foot issues, callus, corn, bunion-follow-up with podiatrist soon  See your eye doctor soon and have them send [place] a copy of your diabetic eye exam  Vitamin D deficiency-updated labs today  Urinary incontinence concern - UA today. Consider Kegel exercises   General Recommendations: Continue to return yearly for  your annual wellness and preventative care visits.  This gives [place] a chance to discuss healthy lifestyle, exercise, vaccinations, review your chart record, and perform screenings where appropriate.  I recommend you see your eye doctor yearly for routine vision care.  I recommend you see your dentist yearly for routine dental care including hygiene visits twice yearly.   Vaccination recommendations were reviewed Immunization History  Administered Date(s) Administered   Influenza,inj,Quad PF,6+ Mos 07/31/2015, 05/02/2016, 02/19/2017, 03/27/2018, 06/07/2021, 01/09/2022   Influenza,inj,quad, With Preservative 03/26/2017  PFIZER(Purple Top)SARS-COV-2 Vaccination 06/25/2019, 07/18/2019, 02/22/2020   Pfizer Covid-19 Vaccine Bivalent Booster 36yrs & up 06/07/2021   Pneumococcal Polysaccharide-23 07/31/2015, 09/27/2020   Tdap 07/31/2015    Vaccine recommendations: Yearly flu shot in the fall  Counseled on the Shingrix vaccine.  Vaccine information sheet given. Shingrix #1 vaccine given after consent obtained.   Return in 2 months for Shingrix #2.   Screening for cancer: Colon cancer screening: I reviewed your colonoscopy from 2024, repeat in 5 years   Breast cancer screening: You should perform a self breast exam monthly.   We reviewed recommendations for regular mammograms and breast cancer screening. Last mammogram: 4/24  Cervical cancer screening: We reviewed recommendations for pap smear screening. Last pap 2022.   Skin cancer screening: Check your skin regularly for new changes, growing lesions, or other lesions of concern Come in for evaluation if you have skin lesions of concern.  Lung cancer screening: If you have a greater than 20 pack year history of tobacco use, then you may qualify for lung cancer screening with a chest CT scan.   Please call your insurance company to inquire about coverage for this test.  Pancreatic cancer: no current screening test is available  routinely recommended.  (Risk factors: Smoking, overweight or obese, diabetes, chronic pancreatitis, work Nurse, mental health, Solicitor, 74 year old or greater, female greater than female, African-American, family history of pancreatic cancer, hereditary breast, ovarian, melanoma, Lynch, Peutz-jeghers).  We currently don't have screenings for other cancers besides breast, cervical, colon, and lung cancers.  If you have a strong family history of cancer or have other cancer screening concerns, please let me know.    Bone health: Get at least 150 minutes of aerobic exercise weekly Get weight bearing exercise at least once weekly Bone density test:  A bone density test is an imaging test that uses a type of X-ray to measure the amount of calcium and other minerals in your bones. The test may be used to diagnose or screen you for a condition that causes weak or thin bones (osteoporosis), predict your risk for a broken bone (fracture), or determine how well your osteoporosis treatment is working. The bone density test is recommended for females 65 and older, or females or males <65 if certain risk factors such as thyroid disease, long term use of steroids such as for asthma or rheumatological issues, vitamin D deficiency, estrogen deficiency, family history of osteoporosis, self or family history of fragility fracture in first degree relative.  I reviewed her bone density test from 08/2022 that was normal   Heart health: Get at least 150 minutes of aerobic exercise weekly Limit alcohol It is important to maintain a healthy blood pressure and healthy cholesterol numbers  Heart disease screening: Screening for heart disease includes screening for blood pressure, fasting lipids, glucose/diabetes screening, BMI height to weight ratio, reviewed of smoking status, physical activity, and diet.    Goals include blood pressure 120/80 or less, maintaining a healthy lipid/cholesterol profile, preventing  diabetes or keeping diabetes numbers under good control, not smoking or using tobacco products, exercising most days per week or at least 150 minutes per week of exercise, and eating healthy variety of fruits and vegetables, healthy oils, and avoiding unhealthy food choices like fried food, fast food, high sugar and high cholesterol foods.    Other tests may possibly include EKG test, CT coronary calcium score, echocardiogram, exercise treadmill stress test.    Heart disease testing completed: Consider CT coronary heart test, $99 cash pay test.  Consider updated EKG.    Vascular disease screening: For high risk individuals including smokers, diabetes, patients with known heart disease or high blood pressure, kidney disease, and others, screening for vascular disease or atherosclerosis of the arteries is available.  Examples may include carotid ultrasound, abdominal aortic ultrasound, ABI blood flow screening in the legs, thoracic aorta screening.  Let me know if you want to pursue any of these other screenings listed above   Medical care options: I recommend you continue to seek care here first for routine care.  We try really hard to have available appointments Monday through Friday daytime hours for sick visits, acute visits, and physicals.  Urgent care should be used for after hours and weekends for significant issues that cannot wait till the next day.  The emergency department should be used for significant potentially life-threatening emergencies.  The emergency department is expensive, can often have long wait times for less significant concerns, so try to utilize primary care, urgent care, or telemedicine when possible to avoid unnecessary trips to the emergency department.  Virtual visits and telemedicine have been introduced since the pandemic started in 2020, and can be convenient ways to receive medical care.  We offer virtual appointments as well to assist you in a variety of options to  seek medical care.   Legal Take the time to do a Last Will and Testament, advanced directives including Healthcare Power of Attorney and Living Will documents.  Do not leave your family with burdens that can be handled ahead of time.   Advanced Directives: I recommend you consider completing a Health Care Power of Attorney and Living Will.   These documents respect your wishes and help alleviate burdens on your loved ones if you were to become terminally ill or be in a position to need those documents enforced.    You can complete Advanced Directives yourself, have them notarized, then have copies made for our office, for you and for anybody you feel should have them in safe keeping.  Or, you can have an attorney prepare these documents.   If you haven't updated your Last Will and Testament in a while, it may be worthwhile having an attorney prepare these documents together and save on some costs.       Spiritual and Emotional Health Keeping a healthy spiritual life can help you better manage your physical health. Your spiritual life can help you to cope with any issues that may arise with your physical health.  Balance can keep [place] healthy and help [place] to recover.  If you are struggling with your spiritual health there are questions that you may want to ask yourself:  What makes me feel most complete? When do I feel most connected to the rest of the world? Where do I find the most inner strength? What am I doing when I feel whole?  Helpful tips: Being in nature. Some people feel very connected and at peace when they are walking outdoors or are outside. Helping others. Some feel the largest sense of wellbeing when they are of service to others. Being of service can take on many forms. It can be doing volunteer work, being kind to strangers, or offering a hand to a friend in need. Gratitude. Some people find they feel the most connected when they remain grateful. They may make lists of all  the things they are grateful for or say a thank you out loud for all they have.    Emotional Health Are  you in tune with your emotional health?  Check out this link: http://www.marquez-love.com/   Financial Health Make sure you use a budget for your personal finances Make sure you are insured against risks (health insurance, life insurance, auto insurance, etc) Save more, spend less Set financial goals If you need help in this area, good resources include counseling through [employer] or other community resources, have a meeting with a Social research officer, government, and a good resource is Scientist, physiological    Breena was seen today for annual exam.  Diagnoses and all orders for this visit:  Routine general medical examination at a health care facility -     Urinalysis, Routine w reflex microscopic -     Comprehensive metabolic panel -     CBC -     Lipid panel -     Hemoglobin A1c -     Microalbumin/Creatinine Ratio, Urine -     VITAMIN D 25 Hydroxy (Vit-D Deficiency, Fractures) -     MM 3D SCREENING MAMMOGRAM BILATERAL BREAST -     Cytology - PAP([place]) -     Cancel: POCT Urinalysis DIP (Proadvantage Device)  Vaccine counseling -     Zoster Recombinant (Shingrix )  Screening for cervical cancer -     Cytology - PAP([place])  Pre-ulcerative corn or callous  Post-menopausal  Microalbuminuria -     Microalbumin/Creatinine Ratio, Urine -     Cancel: POCT Urinalysis DIP (Proadvantage Device)  Moderate persistent asthma, unspecified whether complicated  Bunion of great toe  Callus of foot  Diabetes mellitus with complication (HCC) -     Hemoglobin A1c  Estrogen deficiency  Hyperlipidemia, unspecified hyperlipidemia type -     Lipid panel  History of colonic polyps  Vitamin D deficiency -     VITAMIN D 25 Hydroxy (Vit-D Deficiency, Fractures)  Screening mammogram for breast cancer -     MM 3D SCREENING MAMMOGRAM BILATERAL  BREAST  Urinary incontinence, unspecified type -     Cancel: POCT Urinalysis DIP (Proadvantage Device)     Follow-up pending labs, yearly for physical

## 2023-07-10 NOTE — Patient Instructions (Signed)
 This visit was a preventative care visit, also known as wellness visit or routine physical.   Topics typically include healthy lifestyle, diet, exercise, preventative care, vaccinations, sick and well care, proper use of emergency dept and after hours care, as well as other concerns.    Separate significant issues discussed: Diabetes-diet controlled currently.  Updated labs today   microalbuminuria-updated labs today.  Continue losartan 25 mg daily  Hyperlipidemia-continue rosuvastatin Crestor 20 mg daily  Asthma-continue albuterol as needed.  Foot issues, callus, corn, bunion-follow-up with podiatrist soon  See your eye doctor soon and have them send [place] a copy of your diabetic eye exam  Vitamin D deficiency-updated labs today  Urinary incontinence concern - UA today. Consider Kegel exercises   General Recommendations: Continue to return yearly for your annual wellness and preventative care visits.  This gives [place] a chance to discuss healthy lifestyle, exercise, vaccinations, review your chart record, and perform screenings where appropriate.  I recommend you see your eye doctor yearly for routine vision care.  I recommend you see your dentist yearly for routine dental care including hygiene visits twice yearly.   Vaccination recommendations were reviewed Immunization History  Administered Date(s) Administered   Influenza,inj,Quad PF,6+ Mos 07/31/2015, 05/02/2016, 02/19/2017, 03/27/2018, 06/07/2021, 01/09/2022   Influenza,inj,quad, With Preservative 03/26/2017   PFIZER(Purple Top)SARS-COV-2 Vaccination 06/25/2019, 07/18/2019, 02/22/2020   Pfizer Covid-19 Vaccine Bivalent Booster 12yrs & up 06/07/2021   Pneumococcal Polysaccharide-23 07/31/2015, 09/27/2020   Tdap 07/31/2015    Vaccine recommendations: Yearly flu shot in the fall  Counseled on the Shingrix vaccine.  Vaccine information sheet given. Shingrix #1 vaccine given after consent obtained.   Return in 2 months for  Shingrix #2.   Screening for cancer: Colon cancer screening: I reviewed your colonoscopy from 2024, repeat in 5 years   Breast cancer screening: You should perform a self breast exam monthly.   We reviewed recommendations for regular mammograms and breast cancer screening. Last mammogram: 4/24  Cervical cancer screening: We reviewed recommendations for pap smear screening. Last pap 2022.   Skin cancer screening: Check your skin regularly for new changes, growing lesions, or other lesions of concern Come in for evaluation if you have skin lesions of concern.  Lung cancer screening: If you have a greater than 20 pack year history of tobacco use, then you may qualify for lung cancer screening with a chest CT scan.   Please call your insurance company to inquire about coverage for this test.  Pancreatic cancer: no current screening test is available routinely recommended.  (Risk factors: Smoking, overweight or obese, diabetes, chronic pancreatitis, work Nurse, mental health, Solicitor, 74 year old or greater, female greater than female, African-American, family history of pancreatic cancer, hereditary breast, ovarian, melanoma, Lynch, Peutz-jeghers).  We currently don't have screenings for other cancers besides breast, cervical, colon, and lung cancers.  If you have a strong family history of cancer or have other cancer screening concerns, please let me know.    Bone health: Get at least 150 minutes of aerobic exercise weekly Get weight bearing exercise at least once weekly Bone density test:  A bone density test is an imaging test that uses a type of X-ray to measure the amount of calcium and other minerals in your bones. The test may be used to diagnose or screen you for a condition that causes weak or thin bones (osteoporosis), predict your risk for a broken bone (fracture), or determine how well your osteoporosis treatment is working. The bone density test is recommended for  females 66 and older, or females or males <65 if certain risk factors such as thyroid disease, long term use of steroids such as for asthma or rheumatological issues, vitamin D deficiency, estrogen deficiency, family history of osteoporosis, self or family history of fragility fracture in first degree relative.  I reviewed her bone density test from 08/2022 that was normal   Heart health: Get at least 150 minutes of aerobic exercise weekly Limit alcohol It is important to maintain a healthy blood pressure and healthy cholesterol numbers  Heart disease screening: Screening for heart disease includes screening for blood pressure, fasting lipids, glucose/diabetes screening, BMI height to weight ratio, reviewed of smoking status, physical activity, and diet.    Goals include blood pressure 120/80 or less, maintaining a healthy lipid/cholesterol profile, preventing diabetes or keeping diabetes numbers under good control, not smoking or using tobacco products, exercising most days per week or at least 150 minutes per week of exercise, and eating healthy variety of fruits and vegetables, healthy oils, and avoiding unhealthy food choices like fried food, fast food, high sugar and high cholesterol foods.    Other tests may possibly include EKG test, CT coronary calcium score, echocardiogram, exercise treadmill stress test.    Heart disease testing completed: Consider CT coronary heart test, $99 cash pay test.  Consider updated EKG.    Vascular disease screening: For high risk individuals including smokers, diabetes, patients with known heart disease or high blood pressure, kidney disease, and others, screening for vascular disease or atherosclerosis of the arteries is available.  Examples may include carotid ultrasound, abdominal aortic ultrasound, ABI blood flow screening in the legs, thoracic aorta screening.  Let me know if you want to pursue any of these other screenings listed above   Medical  care options: I recommend you continue to seek care here first for routine care.  We try really hard to have available appointments Monday through Friday daytime hours for sick visits, acute visits, and physicals.  Urgent care should be used for after hours and weekends for significant issues that cannot wait till the next day.  The emergency department should be used for significant potentially life-threatening emergencies.  The emergency department is expensive, can often have long wait times for less significant concerns, so try to utilize primary care, urgent care, or telemedicine when possible to avoid unnecessary trips to the emergency department.  Virtual visits and telemedicine have been introduced since the pandemic started in 2020, and can be convenient ways to receive medical care.  We offer virtual appointments as well to assist you in a variety of options to seek medical care.   Legal Take the time to do a Last Will and Testament, advanced directives including Healthcare Power of Attorney and Living Will documents.  Do not leave your family with burdens that can be handled ahead of time.   Advanced Directives: I recommend you consider completing a Health Care Power of Attorney and Living Will.   These documents respect your wishes and help alleviate burdens on your loved ones if you were to become terminally ill or be in a position to need those documents enforced.    You can complete Advanced Directives yourself, have them notarized, then have copies made for our office, for you and for anybody you feel should have them in safe keeping.  Or, you can have an attorney prepare these documents.   If you haven't updated your Last Will and Testament in a while, it may be worthwhile having  an attorney prepare these documents together and save on some costs.       Spiritual and Emotional Health Keeping a healthy spiritual life can help you better manage your physical health. Your spiritual  life can help you to cope with any issues that may arise with your physical health.  Balance can keep [place] healthy and help [place] to recover.  If you are struggling with your spiritual health there are questions that you may want to ask yourself:  What makes me feel most complete? When do I feel most connected to the rest of the world? Where do I find the most inner strength? What am I doing when I feel whole?  Helpful tips: Being in nature. Some people feel very connected and at peace when they are walking outdoors or are outside. Helping others. Some feel the largest sense of wellbeing when they are of service to others. Being of service can take on many forms. It can be doing volunteer work, being kind to strangers, or offering a hand to a friend in need. Gratitude. Some people find they feel the most connected when they remain grateful. They may make lists of all the things they are grateful for or say a thank you out loud for all they have.    Emotional Health Are you in tune with your emotional health?  Check out this link: http://www.marquez-love.com/   Financial Health Make sure you use a budget for your personal finances Make sure you are insured against risks (health insurance, life insurance, auto insurance, etc) Save more, spend less Set financial goals If you need help in this area, good resources include counseling through [employer] or other community resources, have a meeting with a Social research officer, government, and a good resource is [employer]

## 2023-07-10 NOTE — Progress Notes (Unsigned)
 Feet issues, sees podiatry  BP 130/80   Pulse 71   Wt 182 lb 9.6 oz (82.8 kg)   BMI 29.47 kg/m   Wt Readings from Last 3 Encounters:  07/10/23 182 lb 9.6 oz (82.8 kg)  06/25/23 183 lb (83 kg)  05/16/23 184 lb 6.4 oz (83.6 kg)   BP Readings from Last 3 Encounters:  07/10/23 130/80  05/16/23 (!) 154/82  10/18/22 (!) 148/75    Breatn evan   Dr. Jacobo Forest, 09/03/22, repeat 5year   Past Surgical History:  Procedure Laterality Date   CESAREAN SECTION     COLONOSCOPY  09/2015   polyps, Dr. Viviann Spare Armbruster   COLONOSCOPY  05/2016   [facility], due repeat 09/2018   CYST EXCISION     left volar foot   KNEE ARTHROSCOPY W/ MENISCAL REPAIR  10/2017   left   UMBILICAL HERNIA REPAIR     Shingri  Free style ?  Pap  Chief Complaint  Patient presents with   Medical Management of Chronic Issues    Med check- no concerns, possible Shingrix shot. Due for eye exam

## 2023-07-11 ENCOUNTER — Other Ambulatory Visit: Payer: Self-pay | Admitting: Medical

## 2023-07-11 LAB — COMPREHENSIVE METABOLIC PANEL
ALT: 20 IU/L (ref 0–32)
AST: 16 IU/L (ref 0–40)
Albumin: 4.7 g/dL (ref 3.9–4.9)
Alkaline Phosphatase: 101 IU/L (ref 44–121)
BUN/Creatinine Ratio: 13 (ref 12–28)
BUN: 11 mg/dL (ref 8–27)
Bilirubin Total: 0.5 mg/dL (ref 0.0–1.2)
CO2: 22 mmol/L (ref 20–29)
Calcium: 9.6 mg/dL (ref 8.7–10.3)
Chloride: 105 mmol/L (ref 96–106)
Creatinine, Ser: 0.85 mg/dL (ref 0.57–1.00)
Globulin, Total: 3.3 g/dL (ref 1.5–4.5)
Glucose: 116 mg/dL — ABNORMAL HIGH (ref 70–99)
Potassium: 4.4 mmol/L (ref 3.5–5.2)
Sodium: 142 mmol/L (ref 134–144)
Total Protein: 8 g/dL (ref 6.0–8.5)
eGFR: 76 mL/min/{1.73_m2} (ref >59)

## 2023-07-11 LAB — HEMOGLOBIN A1C
Est. average glucose Bld gHb Est-mCnc: 237 mg/dL
Hgb A1c MFr Bld: 9.9 % — ABNORMAL HIGH (ref 4.8–5.6)

## 2023-07-11 LAB — CBC
Hematocrit: 36.4 % (ref 34.0–46.6)
Hemoglobin: 12 g/dL (ref 11.1–15.9)
MCH: 27.1 pg (ref 26.6–33.0)
MCHC: 33 g/dL (ref 31.5–35.7)
MCV: 82 fL (ref 79–97)
Platelets: 414 10*3/uL (ref 150–450)
RBC: 4.43 x10E6/uL (ref 3.77–5.28)
RDW: 13.1 % (ref 11.7–15.4)
WBC: 7.1 10*3/uL (ref 3.4–10.8)

## 2023-07-11 LAB — MICROALBUMIN / CREATININE URINE RATIO
Creatinine, Urine: 107.4 mg/dL
Microalb/Creat Ratio: 50 mg/g{creat} — ABNORMAL HIGH (ref 0–29)
Microalbumin, Urine: 54.1 ug/mL

## 2023-07-11 LAB — LIPID PANEL
Cholesterol, Total: 122 mg/dL (ref 100–199)
HDL: 44 mg/dL (ref >39)
LDL CALC COMMENT:: 2.8 ratio (ref 0.0–4.4)
LDL Chol Calc (NIH): 56 mg/dL (ref 0–99)
Triglycerides: 121 mg/dL (ref 0–149)
VLDL Cholesterol Cal: 22 mg/dL (ref 5–40)

## 2023-07-11 LAB — URINALYSIS, ROUTINE W REFLEX MICROSCOPIC
Bilirubin, UA: NEGATIVE
Glucose, UA: NEGATIVE
Ketones, UA: NEGATIVE
Leukocytes,UA: NEGATIVE
Nitrite, UA: NEGATIVE
RBC, UA: NEGATIVE
Specific Gravity, UA: 1.016 (ref 1.005–1.030)
Urobilinogen, Ur: 0.2 mg/dL (ref 0.2–1.0)
pH, UA: 6 (ref 5.0–7.5)

## 2023-07-11 LAB — VITAMIN D 25 HYDROXY (VIT D DEFICIENCY, FRACTURES): Vit D, 25-Hydroxy: 17.9 ng/mL — ABNORMAL LOW (ref 30.0–100.0)

## 2023-07-11 MED ORDER — LOSARTAN POTASSIUM 25 MG PO TABS: 25.0000 mg | ORAL_TABLET | Freq: Every day | ORAL | 2 refills | Status: DC

## 2023-07-11 MED ORDER — MOUNJARO 5 MG/0.5ML ~~LOC~~ SOAJ: 5.0000 mg | SUBCUTANEOUS | 1 refills | Status: DC

## 2023-07-11 MED ORDER — VITAMIN D (ERGOCALCIFEROL) 1.25 MG (50000 UNIT) PO CAPS: 50000.0000 [IU] | ORAL_CAPSULE | ORAL | 3 refills | Status: AC

## 2023-07-11 MED ORDER — MOUNJARO 2.5 MG/0.5ML ~~LOC~~ SOAJ: 2.5000 mg | SUBCUTANEOUS | 0 refills | Status: DC

## 2023-07-11 NOTE — Progress Notes (Signed)
 Schedule 32-month follow-up fasting

## 2023-07-14 ENCOUNTER — Telehealth: Payer: Self-pay

## 2023-07-14 ENCOUNTER — Other Ambulatory Visit (HOSPITAL_COMMUNITY): Payer: Self-pay

## 2023-07-14 NOTE — Telephone Encounter (Signed)
 Pharmacy Patient Advocate Encounter   Received notification from CoverMyMeds that prior authorization for [facility] 2.5MG /0.5ML auto-injectors is required/requested.   Insurance verification completed.   The patient is insured through [facility] .   Per test claim: PA required; PA submitted to above mentioned insurance via CoverMyMeds Key/confirmation #/EOC (Key: [id number])       Status is pending

## 2023-07-15 ENCOUNTER — Other Ambulatory Visit (HOSPITAL_COMMUNITY): Payer: Self-pay

## 2023-07-15 NOTE — Telephone Encounter (Signed)
 Pharmacy Patient Advocate Encounter  Received notification from [facility] that Prior Authorization for [facility] 2.5MG /0.5ML auto-injectors has been APPROVED from 3.2.25 to 3.2.28. Ran test claim, Copay is $RTS, RX WAS FILLED ON 07/14/23. This test claim was processed through [facility]- copay amounts may vary at other pharmacies due to pharmacy/plan contracts, or as the patient moves through the different stages of their insurance plan.   PA #/Case ID/Reference #: (Key: [id number])

## 2023-07-16 LAB — CYTOLOGY - PAP
Comment: NEGATIVE
Diagnosis: NEGATIVE
High risk HPV: NEGATIVE

## 2023-07-16 NOTE — Progress Notes (Signed)
 Results sent through MyChart

## 2023-07-21 ENCOUNTER — Telehealth: Payer: Self-pay | Admitting: Medical

## 2023-07-21 MED ORDER — MOUNJARO 2.5 MG/0.5ML ~~LOC~~ SOAJ: 2.5000 mg | SUBCUTANEOUS | 0 refills | Status: DC

## 2023-07-21 NOTE — Telephone Encounter (Signed)
 Refilled 2.5mg  and put a note on this to fill 1st before 5mg 

## 2023-07-21 NOTE — Telephone Encounter (Signed)
 Copied from CRM [id number]. Topic: General - Other >> Jul 21, 2023  3:49 PM [place] B wrote: Reason for CRM: pt called in [facility]'t [facility] pt needs for [facility], They have the higher dosage but told pt she has to start out on the lower dosage first. Please cb to further assist

## 2023-07-23 ENCOUNTER — Telehealth: Admitting: Medical

## 2023-07-23 ENCOUNTER — Other Ambulatory Visit

## 2023-07-23 ENCOUNTER — Encounter: Payer: Self-pay | Admitting: Medical

## 2023-07-23 VITALS — BP 120/70 | HR 68 | Temp 97.9°F | Resp 16 | Wt 182.0 lb

## 2023-07-23 DIAGNOSIS — J3489 Other specified disorders of nose and nasal sinuses: Secondary | ICD-10-CM | POA: Diagnosis not present

## 2023-07-23 DIAGNOSIS — J069 Acute upper respiratory infection, unspecified: Secondary | ICD-10-CM | POA: Diagnosis not present

## 2023-07-23 DIAGNOSIS — Z7984 Long term (current) use of oral hypoglycemic drugs: Secondary | ICD-10-CM

## 2023-07-23 DIAGNOSIS — E118 Type 2 diabetes mellitus with unspecified complications: Secondary | ICD-10-CM

## 2023-07-23 DIAGNOSIS — R051 Acute cough: Secondary | ICD-10-CM

## 2023-07-23 DIAGNOSIS — U071 COVID-19: Secondary | ICD-10-CM | POA: Diagnosis not present

## 2023-07-23 LAB — POCT INFLUENZA A/B
Influenza A, POC: NEGATIVE
Influenza B, POC: NEGATIVE

## 2023-07-23 LAB — POC COVID19 BINAXNOW: SARS Coronavirus 2 Ag: POSITIVE — AB

## 2023-07-23 MED ORDER — NIRMATRELVIR/RITONAVIR (PAXLOVID)TABLET: 3.0000 | ORAL_TABLET | Freq: Two times a day (BID) | ORAL | 0 refills | Status: AC

## 2023-07-23 NOTE — Patient Instructions (Signed)
 Covid infection, covid illness general recommendations:  I recommend you rest, hydrate well with water and clear fluids throughout the day.  If you feel dry in the mouth, tongue or feel that you are urinating as much as usual, then increase hydration.  You urine should be like yellow to clear, not dark yellow or darker.     Pain, body aches, or fever: You can use Tylenol /Acetaminophen 325mg  over the counter for pain or fever, every 4-6 hours   Cough and congestion: You can use over the counter Mucinex DM or Coricidin HBP for cough and congestion   Drainage and congestion: You can use over the counter antihistamine such as zyrtec, allegra, or benadryl as directed on the label   Shortness of breath or wheezing: You can use  Albuterol 1-2 puffs every 4 hours as needed for wheezing, shortness of breath, chest tightness, or coughing fits.  Caution as this medication can cause jitteriness/shakes.   Nausea: You can use over the counter Emetrol for nausea.     Antiviral medication: Begin medication Paxlovid to help reduce your risk of hospitalization or severe illness.  If medicaiton is not available, too expensive or not covered by insurance, then call us back.   Other supportive measures: I recommend extra vitamins to help your body fight the illness.   Consider over the counter vitamin pack such as EmergenC Immune Plus which contains extra vitamin C, vitamin D and zinc.     In the next few days, if you are having trouble breathing, if you are very weak, have high fever 103 or higher consistently despite Tylenol, or uncontrollable nausea and vomiting, then call or go to the emergency department.    If you have other questions or have other symptoms or questions you are concerned about then please make a virtual visit   Covid symptoms such as fatigue and cough can linger over 2 weeks, even after the initial fever, aches, chills, and other initial symptoms.   Self Quarantine: The  CDC, Centers for Disease Control has recommended a self quarantine of 5 days from the start of your illness until you are symptom-free including at least 24 hours of no symptoms including no fever, no shortness of breath, and no body aches and chills, by day 5 before returning to work or general contact with the public.  What does self quarantine mean: avoiding contact with people as much as possible.   Particularly in your house, isolate your self from others in a separate room, wear a mask when possible in the room, particularly if coughing a lot.   Have others bring food, water, medications, etc., to your door, but avoid direct contact with your household contacts during this time to avoid spreading the infection to them.   If you have a separate bathroom and living quarters during the next 2 weeks away from others, that would be preferable.    If you can't completely isolate, then wear a mask, wash hands frequently with soap and water for at least 15 seconds, minimize close contact with others, and have a friend or family member check regularly from a distance to make sure you are not getting seriously worse.     You should not be going out in public, should not be going to stores, to work or other public places until all your symptoms have resolved and at least 5 days + 24 hours of no symptoms at all have transpired.   Ideally you should avoid contact with  others for a full 5 days if possible.  One of the goals is to limit spread to high risk people; people that are older and elderly, people with multiple health issues like diabetes, heart disease, lung disease, and anybody that has weakened immune systems such as people with cancer or on immunosuppressive therapy.

## 2023-07-23 NOTE — Progress Notes (Signed)
 Subjective:     Patient ID: Stacey Simon, female   DOB: Feb 12, 1961, 63 y.o.   MRN: [id number]  This visit type was conducted due to national recommendations for restrictions regarding the COVID-19 Pandemic (e.g. social distancing) in an effort to limit this patient's exposure and mitigate transmission in our community.  Due to their co-morbid illnesses, this patient is at least at moderate risk for complications without adequate follow up.  This format is felt to be most appropriate for this patient at this time.    Documentation for virtual audio and video telecommunications through [facility] encounter:  The patient was located at home. The provider was located in the office. The patient did consent to this visit and is aware of possible charges through their insurance for this visit.  The other persons participating in this telemedicine service were none. Time spent on call was 20 minutes and in review of previous records 20 minutes total.  This virtual service is not related to other E/M service within previous 7 days.   HPI Chief Complaint  Patient presents with   Cough    Cough, headache, congestion, body aches, no fever. Symptoms started monday   She notes starting with scratchy throat 2 days ago.  Then next day lot of cough, runny nose, congested, aches and pains. Has some wheezing, used inhaler some.  Has had some morning colored mucous, but not all day.   Possible sick contacts exposure at work.  Used some tylenol.  Used some mucinex lozenges.  No fever.  No NVD.     She just picked up her Mounjaro from the pharmacy.  There was a little bit of a hold-up with insurance.  She is a little bit concerned about starting this medicine given mom had a history of pancreatic cancer.  She was on metformin prior , but her blood sugars were not at goal.  She wants to talk about risk of Mounjaro  No other aggravating or relieving factors. No other complaint.   Past  Medical History:  Diagnosis Date   Allergy    Anemia    in college   Asthma    a few prior hospitalizations, last 1992   Diabetes mellitus without complication (HCC) 08/2015   Hyperlipidemia    Insomnia    Low HDL (under 40) 08/2015   Microalbuminuria    Current Outpatient Medications on File Prior to Visit  Medication Sig Dispense Refill   albuterol (VENTOLIN HFA) 108 (90 Base) MCG/ACT inhaler Inhale 2 puffs into the lungs every 6 (six) hours as needed for wheezing or shortness of breath. 8.5 each 1   ibuprofen (ADVIL) 600 MG tablet Take 1 tablet (600 mg total) by mouth every 8 (eight) hours as needed. 30 tablet 0   losartan (COZAAR) 25 MG tablet Take 1 tablet (25 mg total) by mouth daily. 90 tablet 2   rosuvastatin (CRESTOR) 20 MG tablet TAKE 1 TABLET (20 MG TOTAL) BY MOUTH DAILY IN THE EVENING 90 tablet 0   tirzepatide (MOUNJARO) 2.5 MG/0.5ML Pen Inject 2.5 mg into the skin once a week. 2 mL 0   albuterol (VENTOLIN HFA) 108 (90 Base) MCG/ACT inhaler Inhale 2 puffs into the lungs every 6 (six) hours as needed for wheezing or shortness of breath. 8 g 2   Continuous Glucose Sensor (FREESTYLE LIBRE 3 SENSOR) MISC 1 each by Does not apply route every 14 (fourteen) days. Place 1 sensor on the skin every 14 days. Use to check glucose continuously  2 each 11   glucose blood test strip Use as instructed (Patient not taking: Reported on 05/16/2023) 100 each 12   Lancets (ONETOUCH ULTRASOFT) lancets Use as instructed (Patient not taking: Reported on 05/16/2023) 100 each 12   tirzepatide (MOUNJARO) 5 MG/0.5ML Pen Inject 5 mg into the skin once a week. (Patient not taking: Reported on 07/23/2023) 2 mL 1   Vitamin D, Ergocalciferol, (DRISDOL) 1.25 MG (50000 UNIT) CAPS capsule Take 1 capsule (50,000 Units total) by mouth every 7 (seven) days. 12 capsule 3   [DISCONTINUED] levalbuterol (XOPENEX HFA) 45 MCG/ACT inhaler Inhale 2 puffs into the lungs every 6 (six) hours as needed for wheezing. (Patient not taking:  Reported on 04/25/2019) 1 Inhaler 12   [DISCONTINUED] metFORMIN (GLUCOPHAGE) 500 MG tablet TAKE 1 TABLET BY MOUTH EVERY DAY WITH BREAKFAST (Patient not taking: Reported on 04/19/2019) 90 tablet 3   No current facility-administered medications on file prior to visit.    Review of Systems As in subjective    Objective:   Physical Exam Due to coronavirus pandemic stay at home measures, patient visit was virtual and they were not examined in person.   BP 120/70   Pulse 68   Temp 97.9 F (36.6 C)   Resp 16   Wt 182 lb (82.6 kg)   SpO2 92% Comment: some wheezing  BMI 29.38 kg/m   Gen: wd, wn, nad No labored breathing or wheezing  In person exam: Lungs clear, no wheezing or rhonchi Heart regular rate rhythm, normal S1-S2 Somewhat ill-appearing but not severe      Assessment:     Encounter Diagnoses  Name Primary?   COVID Yes   Upper respiratory tract infection, unspecified type    Acute cough    Purulent nasal discharge    Diabetes mellitus with complication (HCC)        Plan:     URI symptoms, cough, nasal drainage She will come to our office for in person evaluation  I recommend good water intake throughout the day  Consider OTC Mucinex DM, Robitussin DM or Coricidin HBP for cough and congestion   Diabetes not at goal-she was on metformin but not getting to goal and she has some microalbuminuria.  We discussed options for therapy.  I reassured her that saw any cancer risk of the Greggory Keen is low.  We discussed common risk such as nausea, indigestion, bowel changes.  She is willing to at least try the [facility] for a month or 2.  So begin 2.5 mg weekly.  If she does not she does not want to do this we will probably do a trial of metformin plus glipizide or metformin plus insulin or some other regimen  At our office she tested for COVID positive.  Covid infection, covid illness general recommendations:  I recommend you rest, hydrate well with water and clear fluids  throughout the day.  If you feel dry in the mouth, tongue or feel that you are urinating as much as usual, then increase hydration.  You urine should be like yellow to clear, not dark yellow or darker.     Pain, body aches, or fever: You can use Tylenol /Acetaminophen 325mg  over the counter for pain or fever, every 4-6 hours   Cough and congestion: You can use over the counter Mucinex DM or Coricidin HBP for cough and congestion   Drainage and congestion: You can use over the counter antihistamine such as zyrtec, allegra, or benadryl as directed on the label  Shortness of breath or wheezing: You can use  Albuterol 1-2 puffs every 4 hours as needed for wheezing, shortness of breath, chest tightness, or coughing fits.  Caution as this medication can cause jitteriness/shakes.   Nausea: You can use over the counter Emetrol for nausea.     Antiviral medication: Begin medication Paxlovid to help reduce your risk of hospitalization or severe illness.  If medicaiton is not available, too expensive or not covered by insurance, then call us back.   Other supportive measures: I recommend extra vitamins to help your body fight the illness.   Consider over the counter vitamin pack such as EmergenC Immune Plus which contains extra vitamin C, vitamin D and zinc.     In the next few days, if you are having trouble breathing, if you are very weak, have high fever 103 or higher consistently despite Tylenol, or uncontrollable nausea and vomiting, then call or go to the emergency department.    If you have other questions or have other symptoms or questions you are concerned about then please make a virtual visit   Covid symptoms such as fatigue and cough can linger over 2 weeks, even after the initial fever, aches, chills, and other initial symptoms.   Self Quarantine: The CDC, Centers for Disease Control has recommended a self quarantine of 5 days from the start of your illness until you are  symptom-free including at least 24 hours of no symptoms including no fever, no shortness of breath, and no body aches and chills, by day 5 before returning to work or general contact with the public.  What does self quarantine mean: avoiding contact with people as much as possible.   Particularly in your house, isolate your self from others in a separate room, wear a mask when possible in the room, particularly if coughing a lot.   Have others bring food, water, medications, etc., to your door, but avoid direct contact with your household contacts during this time to avoid spreading the infection to them.   If you have a separate bathroom and living quarters during the next 2 weeks away from others, that would be preferable.    If you can't completely isolate, then wear a mask, wash hands frequently with soap and water for at least 15 seconds, minimize close contact with others, and have a friend or family member check regularly from a distance to make sure you are not getting seriously worse.     You should not be going out in public, should not be going to stores, to work or other public places until all your symptoms have resolved and at least 5 days + 24 hours of no symptoms at all have transpired.   Ideally you should avoid contact with others for a full 5 days if possible.  One of the goals is to limit spread to high risk people; people that are older and elderly, people with multiple health issues like diabetes, heart disease, lung disease, and anybody that has weakened immune systems such as people with cancer or on immunosuppressive therapy.  Ceyda was seen today for cough.  Diagnoses and all orders for this visit:  COVID  Upper respiratory tract infection, unspecified type -     POC COVID-19 -     POCT Influenza A/B  Acute cough -     POC COVID-19 -     POCT Influenza A/B  Purulent nasal discharge  Diabetes mellitus with complication (HCC)  Other orders -  nirmatrelvir/ritonavir (PAXLOVID) 20 x 150 MG & 10 x 100MG  TABS; Take 3 tablets by mouth 2 (two) times daily for 5 days. (Take nirmatrelvir 150 mg two tablets twice daily for 5 days and ritonavir 100 mg one tablet twice daily for 5 days) Patient GFR is 76    F/u 30mo

## 2023-07-24 ENCOUNTER — Telehealth: Payer: Self-pay | Admitting: Internal Medicine

## 2023-07-24 NOTE — Telephone Encounter (Signed)
 Copied from CRM [id number]. Topic: Clinical - Medication Question >> Jul 24, 2023  3:07 PM Gery Pray wrote: Reason for CRM: Patient called in stating that she is in pain on lumbar on the right side. Patient states that pain is between a 7 and 8. In addition, patient states that she has some ibuprofen 600 remaining from her previous visit and would like to know if she is able to take some those to rid the pain. Patient can be contacted back at [phone].

## 2023-07-24 NOTE — Telephone Encounter (Signed)
 Pt was notified.

## 2023-08-18 ENCOUNTER — Other Ambulatory Visit: Payer: Self-pay | Admitting: Medical

## 2023-08-18 NOTE — Telephone Encounter (Signed)
 Copied from CRM [id number]. Topic: Clinical - Medication Refill >> Aug 18, 2023  3:58 PM Patsy Lager T wrote: Most Recent Primary Care Visit:  Provider: [facility]  Department: [facility]  Visit Type: LAB  Date: 07/23/2023  Medication: tirzepatide [facility]) 5 MG/0.5ML Pen    Has the patient contacted their pharmacy? Yes   Is this the correct pharmacy for this prescription? Yes If no, delete pharmacy and type the correct one.  This is the patient's preferred pharmacy:  [facility] [address] [place] - [address] [place] [address] Phone: [phone] Fax: [phone]  Has the prescription been filled recently? Yes  Is the patient out of the medication? Yes  Has the patient been seen for an appointment in the last year OR does the patient have an upcoming appointment? Yes  Can we respond through MyChart? No  Agent: Please be advised that Rx refills may take up to 3 business days. We ask that you follow-up with your pharmacy.  Patient wants a call to let her know if provider will go up on her dosage

## 2023-08-19 NOTE — Telephone Encounter (Signed)
 Called patient to find out what dose she is taking as Stacey Simon called in 2.5 and 5mg  at last visit.

## 2023-08-20 ENCOUNTER — Encounter: Payer: Self-pay | Admitting: Internal Medicine

## 2023-09-09 ENCOUNTER — Other Ambulatory Visit: Payer: 59

## 2023-09-14 ENCOUNTER — Other Ambulatory Visit: Payer: Self-pay | Admitting: Medical

## 2023-09-15 ENCOUNTER — Other Ambulatory Visit: Payer: Self-pay | Admitting: Medical

## 2023-09-15 ENCOUNTER — Telehealth: Payer: Self-pay | Admitting: Medical

## 2023-09-15 NOTE — Telephone Encounter (Signed)
 Duplicate refill request. Medication is already pending.

## 2023-09-15 NOTE — Telephone Encounter (Signed)
 Copied from CRM [id number]. Topic: Clinical - Medication Refill >> Sep 15, 2023  4:46 PM Hassie Lint wrote: Most Recent Primary Care Visit:  Provider: [facility]  Department: [facility]  Visit Type: LAB  Date: 07/23/2023  Medication: tirzepatide (MOUNJARO) 2.5 MG/0.5ML Pen  Has the patient contacted their pharmacy? Yes (Agent: If no, request that the patient contact the pharmacy for the refill. If patient does not wish to contact the pharmacy document the reason why and proceed with request.) (Agent: If yes, when and what did the pharmacy advise?)  Is this the correct pharmacy for this prescription? Yes If no, delete pharmacy and type the correct one.  This is the patient's preferred pharmacy:  [facility] [address] [place] - [address] [place] [address] Phone: [phone] Fax: [phone]  Has the prescription been filled recently? No  Is the patient out of the medication? Yes  Has the patient been seen for an appointment in the last year OR does the patient have an upcoming appointment? Yes  Can we respond through MyChart? Yes  Agent: Please be advised that Rx refills may take up to 3 business days. We ask that you follow-up with your pharmacy.

## 2023-09-16 MED ORDER — ROSUVASTATIN CALCIUM 20 MG PO TABS: ORAL_TABLET | ORAL | 0 refills | Status: DC

## 2023-09-16 MED ORDER — MOUNJARO 5 MG/0.5ML ~~LOC~~ SOAJ: 5.0000 mg | SUBCUTANEOUS | 1 refills | Status: DC

## 2023-09-16 NOTE — Telephone Encounter (Signed)
 Pt hasn't picked up the 5mg  yet. Advised her I would send in a new rx for 5mg  even though it was sent in February

## 2023-10-07 ENCOUNTER — Ambulatory Visit: Payer: 59 | Admitting: Medical

## 2023-10-07 ENCOUNTER — Telehealth: Payer: Self-pay | Admitting: Internal Medicine

## 2023-10-07 NOTE — Telephone Encounter (Signed)
 Patient has NO SHOWED twice in the last month and been a total of 3 NO Shows since 01/2023. Please advise  This patient no showed for their appointment today.Which of the following is necessary for this patient.   A) No follow-up necessary   B) Follow-up urgent. Locate Patient Immediately.   C) Follow-up necessary. Contact patient and Schedule visit in ____ Days.   D) Follow-up Advised. Contact patient and Schedule visit in ____ Days.  E) Please Send no show letter to patient.

## 2023-10-08 ENCOUNTER — Ambulatory Visit

## 2023-10-08 ENCOUNTER — Other Ambulatory Visit (INDEPENDENT_AMBULATORY_CARE_PROVIDER_SITE_OTHER)

## 2023-10-08 ENCOUNTER — Other Ambulatory Visit: Payer: Self-pay | Admitting: Medical

## 2023-10-08 DIAGNOSIS — Z23 Encounter for immunization: Secondary | ICD-10-CM | POA: Diagnosis not present

## 2023-10-08 DIAGNOSIS — E118 Type 2 diabetes mellitus with unspecified complications: Secondary | ICD-10-CM

## 2023-10-08 DIAGNOSIS — E559 Vitamin D deficiency, unspecified: Secondary | ICD-10-CM

## 2023-10-08 NOTE — Telephone Encounter (Signed)
 Pt made aware and appt rescheduled

## 2023-10-09 ENCOUNTER — Ambulatory Visit: Payer: Self-pay | Admitting: Medical

## 2023-10-09 ENCOUNTER — Other Ambulatory Visit: Payer: Self-pay | Admitting: Medical

## 2023-10-09 LAB — BASIC METABOLIC PANEL WITH GFR
BUN/Creatinine Ratio: 9 — ABNORMAL LOW (ref 12–28)
BUN: 8 mg/dL (ref 8–27)
CO2: 20 mmol/L (ref 20–29)
Calcium: 9.2 mg/dL (ref 8.7–10.3)
Chloride: 105 mmol/L (ref 96–106)
Creatinine, Ser: 0.87 mg/dL (ref 0.57–1.00)
Glucose: 113 mg/dL — ABNORMAL HIGH (ref 70–99)
Potassium: 4.1 mmol/L (ref 3.5–5.2)
Sodium: 142 mmol/L (ref 134–144)
eGFR: 74 mL/min/{1.73_m2} (ref >59)

## 2023-10-09 LAB — HEMOGLOBIN A1C
Est. average glucose Bld gHb Est-mCnc: 146 mg/dL
Hgb A1c MFr Bld: 6.7 % — ABNORMAL HIGH (ref 4.8–5.6)

## 2023-10-09 LAB — VITAMIN D 25 HYDROXY (VIT D DEFICIENCY, FRACTURES): Vit D, 25-Hydroxy: 86.4 ng/mL (ref 30.0–100.0)

## 2023-10-09 MED ORDER — TIRZEPATIDE 7.5 MG/0.5ML ~~LOC~~ SOAJ: 7.5000 mg | SUBCUTANEOUS | 1 refills | Status: DC

## 2023-10-09 NOTE — Progress Notes (Signed)
 Results sent through MyChart

## 2023-10-14 ENCOUNTER — Ambulatory Visit: Admitting: Medical

## 2023-10-14 ENCOUNTER — Encounter: Payer: Self-pay | Admitting: Medical

## 2023-10-14 VITALS — BP 122/68 | HR 63 | Ht 66.5 in | Wt 164.2 lb

## 2023-10-14 DIAGNOSIS — E559 Vitamin D deficiency, unspecified: Secondary | ICD-10-CM | POA: Diagnosis not present

## 2023-10-14 DIAGNOSIS — E118 Type 2 diabetes mellitus with unspecified complications: Secondary | ICD-10-CM

## 2023-10-14 DIAGNOSIS — R809 Proteinuria, unspecified: Secondary | ICD-10-CM

## 2023-10-14 DIAGNOSIS — E785 Hyperlipidemia, unspecified: Secondary | ICD-10-CM

## 2023-10-14 MED ORDER — FREESTYLE LIBRE 3 PLUS SENSOR MISC: 5 refills | Status: DC

## 2023-10-14 NOTE — Progress Notes (Signed)
 Subjective:  Stacey Simon is a 63 y.o. female who presents for Chief Complaint  Patient presents with   Follow-up    Follow-up on diabetes, no changes to medical history. Overdue for an eye exam     Here for med  check.  Last visit for physical in 06/2023, hga1c was not at goal.  We initiated mounjaro .   Currently on Mounjaro  5mg  for past month. Ready to go to next dose.  Denies any significant side effects.   Eats less quantity.    Using freestyle monitor.    Exercise - active, some treadmill use.    Compliant with losartan  25mg  daily  Compliant with Crestor  20mg  daily.  No side effects  Did 3 months of vitamin D  50k units.   Finished this 3 weeks ago.  No other aggravating or relieving factors.    No other c/o.  Past Medical History:  Diagnosis Date   Allergy     Anemia    in college   Asthma    a few prior hospitalizations, last 1992   Diabetes mellitus without complication (HCC) 08/2015   Hyperlipidemia    Insomnia    Low HDL (under 40) 08/2015   Microalbuminuria    Current Outpatient Medications on File Prior to Visit  Medication Sig Dispense Refill   albuterol  (VENTOLIN  HFA) 108 (90 Base) MCG/ACT inhaler Inhale 2 puffs into the lungs every 6 (six) hours as needed for wheezing or shortness of breath. 8.5 each 1   losartan  (COZAAR ) 25 MG tablet Take 1 tablet (25 mg total) by mouth daily. 90 tablet 2   rosuvastatin  (CRESTOR ) 20 MG tablet TAKE 1 TABLET (20 MG TOTAL) BY MOUTH DAILY IN THE EVENING 90 tablet 0   tirzepatide  (MOUNJARO ) 7.5 MG/0.5ML Pen Inject 7.5 mg into the skin once a week. 6 mL 1   albuterol  (VENTOLIN  HFA) 108 (90 Base) MCG/ACT inhaler Inhale 2 puffs into the lungs every 6 (six) hours as needed for wheezing or shortness of breath. 8 g 2   albuterol  (VENTOLIN  HFA) 108 (90 Base) MCG/ACT inhaler TAKE 2 PUFFS BY MOUTH EVERY 6 HOURS AS NEEDED FOR WHEEZE OR SHORTNESS OF BREATH 18 each 1   glucose blood test strip Use as instructed (Patient not  taking: Reported on 05/16/2023) 100 each 12   ibuprofen  (ADVIL ) 600 MG tablet Take 1 tablet (600 mg total) by mouth every 8 (eight) hours as needed. 30 tablet 0   Lancets (ONETOUCH ULTRASOFT) lancets Use as instructed (Patient not taking: Reported on 05/16/2023) 100 each 12   Vitamin D , Ergocalciferol , (DRISDOL ) 1.25 MG (50000 UNIT) CAPS capsule Take 1 capsule (50,000 Units total) by mouth every 7 (seven) days. 12 capsule 3   [DISCONTINUED] levalbuterol  (XOPENEX  HFA) 45 MCG/ACT inhaler Inhale 2 puffs into the lungs every 6 (six) hours as needed for wheezing. (Patient not taking: Reported on 04/25/2019) 1 Inhaler 12   [DISCONTINUED] metFORMIN  (GLUCOPHAGE ) 500 MG tablet TAKE 1 TABLET BY MOUTH EVERY DAY WITH BREAKFAST (Patient not taking: Reported on 04/19/2019) 90 tablet 3   No current facility-administered medications on file prior to visit.     The following portions of the patient's history were reviewed and updated as appropriate: allergies, current medications, past family history, past medical history, past social history, past surgical history and problem list.  ROS Otherwise as in subjective above    Objective: BP 122/68   Pulse 63   Ht 5' 6.5" (1.689 m)   Wt 164 lb 3.2 oz (74.5  kg)   SpO2 100%   BMI 26.11 kg/m   BP Readings from Last 3 Encounters:  10/14/23 122/68  07/23/23 120/70  07/10/23 130/80   Wt Readings from Last 3 Encounters:  10/14/23 164 lb 3.2 oz (74.5 kg)  07/23/23 182 lb (82.6 kg)  07/10/23 182 lb (82.6 kg)   General appearance: alert, no distress, well developed, well nourished Neck: supple, no lymphadenopathy, no thyromegaly, no masses Heart: RRR, normal S1, S2, no murmurs Lungs: CTA bilaterally, no wheezes, rhonchi, or rales Pulses: 2+ radial pulses, 2+ pedal pulses, normal cap refill Ext: no edema   Assessment: Encounter Diagnoses  Name Primary?   Diabetes mellitus with complication (HCC) Yes   Vitamin D  deficiency    Hyperlipidemia, unspecified  hyperlipidemia type    Microalbuminuria      Plan: Diabetes - recent HgbA1C 6.7 % at goal.  She just finished Mounjaro  5mg  weekly.  Go up to 7.5mg  weekly.  Not sure she will be able to tolerated higher doses.  Continue free style libre monitoring.  In I 1 month we will either go up to 10mg  or may need to stay at 7.5mg  if any hypoglycemia or not feeling well.    Vit D deficiency - continue weekly vitamin D  supplement  Microalbuminuria - continue losartan  but if BP starts dropping or she loses another 10lb, we may need to lower dose of losartan .  Continue good water intake.  Hyperlipidemia - continue Crestor  20mg  daily.  Jovani was seen today for follow-up.  Diagnoses and all orders for this visit:  Diabetes mellitus with complication (HCC)  Vitamin D  deficiency  Hyperlipidemia, unspecified hyperlipidemia type  Microalbuminuria  Other orders -     Continuous Glucose Sensor (FREESTYLE LIBRE 3 PLUS SENSOR) MISC; Change sensor every 15 days.    Follow up: 3mo

## 2023-10-15 ENCOUNTER — Telehealth: Payer: Self-pay | Admitting: Internal Medicine

## 2023-10-15 NOTE — Telephone Encounter (Signed)
 Can we check and see if PA is needed  Copied from CRM [id number]. Topic: Clinical - Medication Prior Auth >> Oct 15, 2023  4:44 PM Jim Motts C wrote: Reason for CRM: [facility] called and [facility] and they stated the medication is on hold and that she needs to call her insurance.  tirzepatide  (MOUNJARO ) 7.5 MG/0.5ML  [id number] [place], [place] - [address] [place] [address] Phone: [phone] Fax: [phone]

## 2023-10-16 ENCOUNTER — Telehealth: Payer: Self-pay

## 2023-10-16 ENCOUNTER — Other Ambulatory Visit (HOSPITAL_COMMUNITY): Payer: Self-pay

## 2023-10-16 NOTE — Telephone Encounter (Signed)
    Pt must reply back to pharmacy text

## 2023-10-17 NOTE — Telephone Encounter (Signed)
 Sent message through [employer]

## 2023-10-20 NOTE — Telephone Encounter (Signed)
 Copied from CRM [id number]. Topic: Appointments - Scheduling Inquiry for Clinic >> Oct 20, 2023  3:04 PM Antwanette L wrote: Reason for CRM: Patient is requesting a callback from Dr. Azalea Lento. The patient has a friend who wants to see the provider even though he is not taking on new patients. Please patient at [phone]

## 2023-10-21 NOTE — Telephone Encounter (Signed)
 Spoke to patient and she is still doing and will stay on mounjaro . She realized that the $90 copay was for 3 months at a time not a 30 day.   Advised front office you will take her friend on as a patient as well

## 2023-10-21 NOTE — Telephone Encounter (Signed)
 Left message for pt to call back

## 2023-11-05 ENCOUNTER — Ambulatory Visit: Admitting: Podiatry

## 2023-11-05 ENCOUNTER — Ambulatory Visit (INDEPENDENT_AMBULATORY_CARE_PROVIDER_SITE_OTHER)

## 2023-11-05 ENCOUNTER — Encounter: Payer: Self-pay | Admitting: Podiatry

## 2023-11-05 VITALS — Ht 66.5 in | Wt 164.2 lb

## 2023-11-05 DIAGNOSIS — M7752 Other enthesopathy of left foot: Secondary | ICD-10-CM

## 2023-11-05 DIAGNOSIS — D2372 Other benign neoplasm of skin of left lower limb, including hip: Secondary | ICD-10-CM

## 2023-11-06 NOTE — Progress Notes (Signed)
   Chief Complaint  Patient presents with   Foot Pain    Pt is here due to left foot pain states she has a burning sensation the the top of her that has been going on for about a month.    Subjective: 63 y.o. female presenting to the office today for follow-up evaluation of a symptomatic skin lesion to the plantar aspect of the left foot.  This began after having surgery to the plantar aspect of the left foot around 2016-2017.  After surgery she developed of symptomatic skin lesion.   Past Medical History:  Diagnosis Date   Allergy     Anemia    in college   Asthma    a few prior hospitalizations, last 1992   Diabetes mellitus without complication (HCC) 08/2015   Hyperlipidemia    Insomnia    Low HDL (under 40) 08/2015   Microalbuminuria     Past Surgical History:  Procedure Laterality Date   CESAREAN SECTION     COLONOSCOPY  09/2015   polyps, Dr. Elspeth Armbruster   COLONOSCOPY  05/2016   [facility], due repeat 09/2018   CYST EXCISION     left volar foot   KNEE ARTHROSCOPY W/ MENISCAL REPAIR  10/2017   left   UMBILICAL HERNIA REPAIR      No Known Allergies   Objective:  Physical Exam General: Alert and oriented x3 in no acute distress  Dermatology: Hyperkeratotic lesion(s) present on the plantar aspect of the left foot. Pain on palpation with a central nucleated core noted. Skin is warm, dry and supple bilateral lower extremities. Negative for open lesions or macerations.  Vascular: Palpable pedal pulses bilaterally. No edema or erythema noted. Capillary refill within normal limits.  Neurological: Grossly intact via light touch  Musculoskeletal Exam: Pain on palpation at the keratotic lesion(s) noted. Range of motion within normal limits bilateral. Muscle strength 5/5 in all groups bilateral.  Assessment: 1.  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  History of left plantar foot surgery   Plan of Care:  -Patient evaluated -Excisional debridement of  keratoic lesion(s) using a chisel blade was performed without incident.  -Salicylic acid applied with a bandaid.  Recommend OTC salicylic acid daily PRN -Advised against going barefoot.  Recommended supportive tennis shoes and sneakers -Return to the clinic PRN.   Thresa EMERSON Sar, DPM [facility]  Dr. Thresa EMERSON Sar, DPM    [address] [place], [place] [address]                Office [phone]  Fax [phone]

## 2023-12-06 ENCOUNTER — Other Ambulatory Visit: Payer: Self-pay | Admitting: Medical

## 2024-01-13 ENCOUNTER — Other Ambulatory Visit: Payer: Self-pay | Admitting: Medical

## 2024-01-13 ENCOUNTER — Telehealth: Payer: Self-pay | Admitting: Medical

## 2024-01-13 NOTE — Telephone Encounter (Signed)
 Left message for pt to call back

## 2024-01-13 NOTE — Telephone Encounter (Unsigned)
 Copied from CRM [id number]. Topic: Clinical - Medication Refill >> Jan 13, 2024  9:36 AM Myrick T wrote: Medication: tirzepatide  (MOUNJARO ) 7.5 MG/0.5ML Pen  Has the patient contacted their pharmacy? No  This is the patient's preferred pharmacy:  [facility] [address] [place] - [address] [place] [address] Phone: [phone] Fax: [phone]  Is this the correct pharmacy for this prescription? Yes  Has the prescription been filled recently? Yes  Is the patient out of the medication? Yes  Has the patient been seen for an appointment in the last year OR does the patient have an upcoming appointment? Yes  Can we respond through MyChart? Yes  Agent: Please be advised that Rx refills may take up to 3 business days. We ask that you follow-up with your pharmacy.

## 2024-01-13 NOTE — Telephone Encounter (Unsigned)
 Copied from CRM #[id number]. Topic: General - Other >> Jan 13, 2024  5:07 PM Donee H wrote: Reason for CRM: Patient called to speak directly to [place]. She states she was returning a miss call. Please follow back up with patient.

## 2024-01-14 MED ORDER — TIRZEPATIDE 10 MG/0.5ML ~~LOC~~ SOAJ: 10.0000 mg | SUBCUTANEOUS | 0 refills | Status: DC

## 2024-01-14 NOTE — Telephone Encounter (Signed)
Spoke to patient already about this

## 2024-01-14 NOTE — Telephone Encounter (Signed)
 Wanted to find out about dose of med if she wanted to increase dose or stay at current dose.  See refill request

## 2024-01-14 NOTE — Telephone Encounter (Signed)
 Patient would like to increase to 10mg  and see how she does for a month. If she doesn't do well she will back down to 7.5mg  but will let us  know. I have declined the 7.5mg  and sent in 10mg  for a month

## 2024-02-17 ENCOUNTER — Other Ambulatory Visit: Payer: Self-pay | Admitting: Medical

## 2024-02-17 NOTE — Telephone Encounter (Unsigned)
 Copied from CRM #[id number]. Topic: Clinical - Medication Refill >> Feb 17, 2024  4:55 PM Kevelyn M wrote: Medication: tirzepatide  (MOUNJARO ) 10 MG/0.5ML Pen  Has the patient contacted their pharmacy? Yes (Agent: If no, request that the patient contact the pharmacy for the refill. If patient does not wish to contact the pharmacy document the reason why and proceed with request.) (Agent: If yes, when and what did the pharmacy advise?)  This is the patient's preferred pharmacy:  [id number] [place], [place] - [address] [place] [address] Phone: [phone] Fax: [phone]   Is this the correct pharmacy for this prescription? Yes If no, delete pharmacy and type the correct one.   Has the prescription been filled recently? No  Is the patient out of the medication? Yes  Has the patient been seen for an appointment in the last year OR does the patient have an upcoming appointment? Yes  Can we respond through MyChart? Yes  Agent: Please be advised that Rx refills may take up to 3 business days. We ask that you follow-up with your pharmacy.

## 2024-02-18 NOTE — Telephone Encounter (Signed)
 Left detailed message for pt to call back to schedule med check. And wanted to find out if she wanted to stay at 10mg  or go to 12.5mg  as next dose

## 2024-02-19 ENCOUNTER — Telehealth: Payer: Self-pay | Admitting: Medical

## 2024-02-19 MED ORDER — TIRZEPATIDE 10 MG/0.5ML ~~LOC~~ SOAJ: 10.0000 mg | SUBCUTANEOUS | 0 refills | Status: DC

## 2024-02-19 NOTE — Telephone Encounter (Signed)
 Copied from CRM #[id number]. Topic: General - Call Back - No Documentation >> Feb 18, 2024  4:40 PM Kevelyn M wrote: Reason for CRM: Patient calling back for Sabrina. Attempted to transfer but it would not go through.

## 2024-02-19 NOTE — Telephone Encounter (Signed)
 Patient was scheduled for an appt for next week.  Will refill her 10mg  at this time since she is out and then we can discuss increasing at visit

## 2024-02-24 ENCOUNTER — Ambulatory Visit (INDEPENDENT_AMBULATORY_CARE_PROVIDER_SITE_OTHER): Admitting: Medical

## 2024-02-24 VITALS — BP 122/70 | HR 66 | Ht 66.5 in | Wt 157.2 lb

## 2024-02-24 DIAGNOSIS — R0989 Other specified symptoms and signs involving the circulatory and respiratory systems: Secondary | ICD-10-CM | POA: Diagnosis not present

## 2024-02-24 DIAGNOSIS — Z23 Encounter for immunization: Secondary | ICD-10-CM

## 2024-02-24 DIAGNOSIS — E118 Type 2 diabetes mellitus with unspecified complications: Secondary | ICD-10-CM

## 2024-02-24 LAB — POCT GLYCOSYLATED HEMOGLOBIN (HGB A1C): Hemoglobin A1C: 5.7 % — AB (ref 4.0–5.6)

## 2024-02-24 MED ORDER — DEXCOM G7 SENSOR MISC: 5 refills | Status: DC

## 2024-02-24 MED ORDER — GVOKE HYPOPEN 2-PACK 1 MG/0.2ML ~~LOC~~ SOAJ: 1.0000 | Freq: Every day | SUBCUTANEOUS | 4 refills | Status: AC | PRN

## 2024-02-24 MED ORDER — ROSUVASTATIN CALCIUM 20 MG PO TABS: ORAL_TABLET | ORAL | 3 refills | Status: AC

## 2024-02-24 MED ORDER — ALBUTEROL SULFATE HFA 108 (90 BASE) MCG/ACT IN AERS: 1.0000 | INHALATION_SPRAY | Freq: Four times a day (QID) | RESPIRATORY_TRACT | 1 refills | Status: AC | PRN

## 2024-02-24 MED ORDER — LOSARTAN POTASSIUM 25 MG PO TABS: 25.0000 mg | ORAL_TABLET | Freq: Every day | ORAL | 3 refills | Status: AC

## 2024-02-24 MED ORDER — TIRZEPATIDE 10 MG/0.5ML ~~LOC~~ SOAJ: 10.0000 mg | SUBCUTANEOUS | 5 refills | Status: DC

## 2024-02-24 NOTE — Progress Notes (Unsigned)
 Subjective: Chief Complaint  Patient presents with   Medical Management of Chronic Issues    Med check for mounjaro . Felt some nausea    History of Present Illness Stacey Simon is a 63 year old female with type 2 diabetes who presents for a medication check and A1c evaluation.  She has been on Mounjaro  10 mg for one month, after previously being on 7.5 mg. She experiences some nausea, which she attributes to either the medication or food intake.  She is currently taking Crestor  20 mg daily, losartan  25 mg once daily, and vitamin D  50,000 IU weekly. No major side effects from her medications, aside from occasional nausea. She does not monitor her blood pressure at home.  Her last A1c was 6.7 in May, and she is due for a new test today. She had a full panel of labs in April and some additional tests in May. She has not had a recent diabetic eye exam or mammogram.  She recalls a previous discussion about a test to check for blockages, possibly an ABI or heart disease screen, but has not yet had this test. She has a history of headaches, for which a head scan last year was normal. No issues with speech, memory, or confusion. No dizziness or lightheadedness.  She received a flu shot and a COVID shot today. She mentions a past A1c of 9.9.  Past Medical History:  Diagnosis Date   Allergy     Anemia    in college   Asthma    a few prior hospitalizations, last 1992   Diabetes mellitus without complication (HCC) 08/2015   Hyperlipidemia    Insomnia    Low HDL (under 40) 08/2015   Microalbuminuria    Current Outpatient Medications on File Prior to Visit  Medication Sig Dispense Refill   ibuprofen  (ADVIL ) 600 MG tablet Take 1 tablet (600 mg total) by mouth every 8 (eight) hours as needed. 30 tablet 0   Vitamin D , Ergocalciferol , (DRISDOL ) 1.25 MG (50000 UNIT) CAPS capsule Take 1 capsule (50,000 Units total) by mouth every 7 (seven) days. 12 capsule 3   glucose blood test strip  Use as instructed 100 each 12   Lancets (ONETOUCH ULTRASOFT) lancets Use as instructed 100 each 12   [DISCONTINUED] levalbuterol  (XOPENEX  HFA) 45 MCG/ACT inhaler Inhale 2 puffs into the lungs every 6 (six) hours as needed for wheezing. (Patient not taking: Reported on 04/25/2019) 1 Inhaler 12   [DISCONTINUED] metFORMIN  (GLUCOPHAGE ) 500 MG tablet TAKE 1 TABLET BY MOUTH EVERY DAY WITH BREAKFAST (Patient not taking: Reported on 04/19/2019) 90 tablet 3   No current facility-administered medications on file prior to visit.   ROS as in subjective   Objective: BP 122/70   Pulse 66   Ht 5' 6.5 (1.689 m)   Wt 157 lb 3.2 oz (71.3 kg)   BMI 24.99 kg/m   General appearence: alert, no distress, WD/WN,  Neck: supple, no lymphadenopathy, no thyromegaly, no masses Heart: RRR, normal S1, S2, no murmurs Lungs: CTA bilaterally, no wheezes, rhonchi, or rales Pulses: 2+ symmetric, upper and lower extremities, normal cap refill    Assessment and Plan Encounter Diagnoses  Name Primary?   Diabetes mellitus with complication (HCC) Yes   Needs flu shot    COVID-19 vaccine administered    Decreased pedal pulses     Type 2 diabetes mellitus Diabetes well-controlled with Mounjaro . A1c improved to 5.7. Weight decreased to 157 pounds. Occasional nausea noted. - Continue Mounjaro  10 mg. -  Monitor A1c levels regularly. - Educated on Mounjaro  side effects, advised to report significant adverse effects. - Consider dose adjustment if A1c or weight changes.  Essential hypertension Blood pressure controlled. Discussed potential hypotension with weight loss. - Continue losartan  25 mg daily. - Advise home blood pressure monitoring. - Educated on hypotension symptoms, advised to report if BP <110/70 mmHg.  Obesity Weight decreased to 157 pounds. Discussed Mounjaro 's role in weight loss. - Continue current weight management strategies.  Hyperlipidemia Managed with Crestor . No recent lipid profile changes  discussed. - Continue Crestor  20 mg daily.  Vitamin D  deficiency Managed with weekly vitamin D  supplementation. - Continue vitamin D  50,000 IU weekly.   Counseled on the influenza virus vaccine.   Influenza vaccine given after consent obtained.  Counseled on the Covid virus vaccine.   Covid vaccine given after consent obtained.   [name] was seen today for medical management of chronic issues.  Diagnoses and all orders for this visit:  Diabetes mellitus with complication (HCC) -     VAS US  ABI WITH/WO TBI; Future -     HgB A1c  Needs flu shot -     Flu vaccine trivalent PF, 6mos and older(Flulaval,Afluria,Fluarix,Fluzone)  COVID-19 vaccine administered -     Pfizer Comirnaty Covid-19 Vaccine 61yrs & older  Decreased pedal pulses -     VAS US  ABI WITH/WO TBI; Future  Other orders -     Glucagon (GVOKE HYPOPEN 2-PACK) 1 MG/0.2ML SOAJ; Inject 1 each into the skin daily as needed. -     Continuous Glucose Sensor (DEXCOM G7 SENSOR) MISC; Every 10 days -     tirzepatide  (MOUNJARO ) 10 MG/0.5ML Pen; Inject 10 mg into the skin once a week. -     rosuvastatin  (CRESTOR ) 20 MG tablet; TAKE 1 TABLET (20 MG TOTAL) BY MOUTH DAILY IN THE EVENING -     losartan  (COZAAR ) 25 MG tablet; Take 1 tablet (25 mg total) by mouth daily. -     albuterol  (VENTOLIN  HFA) 108 (90 Base) MCG/ACT inhaler; Inhale 1 puff into the lungs every 6 (six) hours as needed for wheezing or shortness of breath.    F/u pending labs

## 2024-03-01 ENCOUNTER — Telehealth: Payer: Self-pay

## 2024-03-01 ENCOUNTER — Other Ambulatory Visit (HOSPITAL_COMMUNITY): Payer: Self-pay

## 2024-03-01 MED ORDER — FREESTYLE LIBRE 3 PLUS SENSOR MISC: 1 refills | Status: AC

## 2024-03-01 NOTE — Telephone Encounter (Signed)
 Hello,                Our team has received a Prior Authorization request for Continuous Glucose Sensor (DEXCOM G7 SENSOR) MISC.  However most Insurance plans will only cover this Prescription if the Patient is Insulin dependent or has had 2 hypoglycemic events of 54 or less. At this time we do not have documentation of that information in the patients chart. Ultimately this would lead to a denial.

## 2024-03-01 NOTE — Telephone Encounter (Signed)
 I placed and order for Freestyle Libre 3 plus. Can we see if this is covered

## 2024-03-02 ENCOUNTER — Telehealth: Payer: Self-pay

## 2024-03-02 ENCOUNTER — Other Ambulatory Visit (HOSPITAL_COMMUNITY): Payer: Self-pay

## 2024-03-02 NOTE — Telephone Encounter (Signed)
 I have ran a T/C For [employer] 3 plus sensor . Please see the details below.   Pharmacy Patient Advocate Encounter  Insurance verification completed.   The patient is insured through [facility] ADVANTAGE/RX ADVANCE   Ran test claim for FREESTYLE LIBRE 3 PLUS SENSOR. Currently a quantity of 2 EACH/72MONTH IS 74.99 AND 6EACH/15MONTH IS 224.00  The pt may call the pharmacy and let them know which month supply she would rather have at this time.  This test claim was processed through [facility] copay amounts may vary at other pharmacies due to pharmacy/plan contracts, or as the patient moves through the different stages of their insurance plan.

## 2024-03-02 NOTE — Telephone Encounter (Signed)
 Left detailed message that she needs to let pharmacy know what month supply does she want

## 2024-03-10 ENCOUNTER — Encounter (HOSPITAL_COMMUNITY): Payer: Self-pay

## 2024-03-16 ENCOUNTER — Other Ambulatory Visit: Payer: Self-pay | Admitting: Medical

## 2024-03-16 ENCOUNTER — Telehealth: Payer: Self-pay | Admitting: Internal Medicine

## 2024-03-16 MED ORDER — MOUNJARO 7.5 MG/0.5ML ~~LOC~~ SOAJ: 7.5000 mg | SUBCUTANEOUS | 2 refills | Status: DC

## 2024-03-16 NOTE — Telephone Encounter (Signed)
 Sugars have been dropping below 70 for the last couple days.  Lowest was 69, 63,69,69, 58,53. Pt wants to know does she need to back down on her mounjaro ?   I will print off libreview papers

## 2024-03-16 NOTE — Telephone Encounter (Signed)
 Pt was notified.

## 2024-03-16 NOTE — Telephone Encounter (Signed)
 Copied from CRM #[id number]. Topic: Clinical - Medication Question >> Mar 16, 2024 10:18 AM Myrick T wrote: Reason for CRM: patient is requesting a call back about her tirzepatide  (MOUNJARO ) 10 MG/0.5ML Pen. Please return patients call

## 2024-03-24 LAB — OPHTHALMOLOGY REPORT-SCANNED

## 2024-04-12 ENCOUNTER — Other Ambulatory Visit (HOSPITAL_COMMUNITY): Payer: Self-pay

## 2024-06-12 ENCOUNTER — Other Ambulatory Visit: Payer: Self-pay | Admitting: Medical

## 2024-07-13 ENCOUNTER — Encounter: Payer: 59 | Admitting: Medical
